# Patient Record
Sex: Female | Born: 1970
Health system: Southern US, Community
[De-identification: ages and names within clinical notes are randomized; demographics above are authoritative.]

## PROBLEM LIST (undated history)

## (undated) DIAGNOSIS — I2699 Other pulmonary embolism without acute cor pulmonale: Secondary | ICD-10-CM

## (undated) DIAGNOSIS — D649 Anemia, unspecified: Secondary | ICD-10-CM

## (undated) HISTORY — PX: TUBAL LIGATION: SHX77

## (undated) HISTORY — PX: TOTAL HIP ARTHROPLASTY: SHX124

## (undated) HISTORY — PX: GASTRIC BYPASS: SHX52

## (undated) HISTORY — PX: CHOLECYSTECTOMY: SHX55

---

## 1998-06-11 ENCOUNTER — Emergency Department (HOSPITAL_COMMUNITY): Admission: EM | Admit: 1998-06-11 | Discharge: 1998-06-12 | Payer: Self-pay | Admitting: Emergency Medicine

## 1998-08-02 ENCOUNTER — Other Ambulatory Visit: Admission: RE | Admit: 1998-08-02 | Discharge: 1998-08-02 | Payer: Self-pay | Admitting: Obstetrics

## 1999-07-31 ENCOUNTER — Other Ambulatory Visit: Admission: RE | Admit: 1999-07-31 | Discharge: 1999-07-31 | Payer: Self-pay | Admitting: Obstetrics

## 2010-06-03 ENCOUNTER — Encounter: Admission: RE | Admit: 2010-06-03 | Discharge: 2010-06-03 | Payer: Self-pay | Admitting: Family Medicine

## 2012-01-22 ENCOUNTER — Other Ambulatory Visit: Payer: Self-pay | Admitting: Family Medicine

## 2012-01-22 DIAGNOSIS — Z1231 Encounter for screening mammogram for malignant neoplasm of breast: Secondary | ICD-10-CM

## 2012-02-09 ENCOUNTER — Ambulatory Visit
Admission: RE | Admit: 2012-02-09 | Discharge: 2012-02-09 | Disposition: A | Discharge status: Home / Self Care | Payer: Private Health Insurance - Indemnity | Source: Ambulatory Visit | Attending: Family Medicine | Admitting: Family Medicine

## 2012-02-09 DIAGNOSIS — Z1231 Encounter for screening mammogram for malignant neoplasm of breast: Secondary | ICD-10-CM

## 2014-07-11 ENCOUNTER — Emergency Department (HOSPITAL_COMMUNITY): Payer: Medicaid Other

## 2014-07-11 ENCOUNTER — Encounter (HOSPITAL_COMMUNITY): Payer: Self-pay | Admitting: Emergency Medicine

## 2014-07-11 ENCOUNTER — Emergency Department (HOSPITAL_COMMUNITY)
Admission: EM | Admit: 2014-07-11 | Discharge: 2014-07-11 | Disposition: A | Discharge status: Home / Self Care | Payer: Medicaid Other | Attending: Emergency Medicine | Admitting: Emergency Medicine

## 2014-07-11 DIAGNOSIS — R209 Unspecified disturbances of skin sensation: Secondary | ICD-10-CM | POA: Insufficient documentation

## 2014-07-11 DIAGNOSIS — R109 Unspecified abdominal pain: Secondary | ICD-10-CM | POA: Insufficient documentation

## 2014-07-11 DIAGNOSIS — R42 Dizziness and giddiness: Secondary | ICD-10-CM | POA: Insufficient documentation

## 2014-07-11 DIAGNOSIS — R51 Headache: Secondary | ICD-10-CM | POA: Diagnosis not present

## 2014-07-11 DIAGNOSIS — R202 Paresthesia of skin: Secondary | ICD-10-CM

## 2014-07-11 DIAGNOSIS — Z3202 Encounter for pregnancy test, result negative: Secondary | ICD-10-CM | POA: Diagnosis not present

## 2014-07-11 DIAGNOSIS — R079 Chest pain, unspecified: Secondary | ICD-10-CM | POA: Insufficient documentation

## 2014-07-11 LAB — URINALYSIS, ROUTINE W REFLEX MICROSCOPIC
Bilirubin Urine: NEGATIVE
Glucose, UA: NEGATIVE mg/dL
Hgb urine dipstick: NEGATIVE
Ketones, ur: NEGATIVE mg/dL
Leukocytes, UA: NEGATIVE
Nitrite: NEGATIVE
Protein, ur: NEGATIVE mg/dL
Specific Gravity, Urine: 1.008 (ref 1.005–1.030)
Urobilinogen, UA: 0.2 mg/dL (ref 0.0–1.0)
pH: 7 (ref 5.0–8.0)

## 2014-07-11 LAB — LIPASE, BLOOD: Lipase: 52 U/L (ref 11–59)

## 2014-07-11 LAB — I-STAT TROPONIN, ED: Troponin i, poc: 0 ng/mL (ref 0.00–0.08)

## 2014-07-11 LAB — COMPREHENSIVE METABOLIC PANEL
ALT: 31 U/L (ref 0–35)
AST: 71 U/L — ABNORMAL HIGH (ref 0–37)
Albumin: 3.4 g/dL — ABNORMAL LOW (ref 3.5–5.2)
Alkaline Phosphatase: 77 U/L (ref 39–117)
Anion gap: 12 (ref 5–15)
BUN: 12 mg/dL (ref 6–23)
CO2: 27 mEq/L (ref 19–32)
Calcium: 8.4 mg/dL (ref 8.4–10.5)
Chloride: 102 mEq/L (ref 96–112)
Creatinine, Ser: 0.75 mg/dL (ref 0.50–1.10)
GFR calc Af Amer: >90 mL/min (ref >90)
GFR calc non Af Amer: >90 mL/min (ref >90)
Glucose, Bld: 83 mg/dL (ref 70–99)
Potassium: 3.4 mEq/L — ABNORMAL LOW (ref 3.7–5.3)
Sodium: 141 mEq/L (ref 137–147)
Total Bilirubin: 0.5 mg/dL (ref 0.3–1.2)
Total Protein: 7.1 g/dL (ref 6.0–8.3)

## 2014-07-11 LAB — POC URINE PREG, ED: Preg Test, Ur: NEGATIVE

## 2014-07-11 LAB — CBC
HCT: 32.5 % — ABNORMAL LOW (ref 36.0–46.0)
Hemoglobin: 10.1 g/dL — ABNORMAL LOW (ref 12.0–15.0)
MCH: 24.8 pg — ABNORMAL LOW (ref 26.0–34.0)
MCHC: 31.1 g/dL (ref 30.0–36.0)
MCV: 79.9 fL (ref 78.0–100.0)
Platelets: 260 10*3/uL (ref 150–400)
RBC: 4.07 MIL/uL (ref 3.87–5.11)
RDW: 15 % (ref 11.5–15.5)
WBC: 5 10*3/uL (ref 4.0–10.5)

## 2014-07-11 MED ORDER — GI COCKTAIL ~~LOC~~
30.0000 mL | Freq: Once | ORAL | Status: AC
  Administered 2014-07-11: 30 mL via ORAL
  Filled 2014-07-11: qty 30

## 2014-07-11 NOTE — Discharge Instructions (Signed)
You can start taking daily baby aspirin. Follow up closely with primary care doctor. Return if symptoms are worsening. Your MRI today is normal with no signs of strokes.    Chest Pain (Nonspecific) It is often hard to give a diagnosis for the cause of chest pain. There is always a chance that your pain could be related to something serious, such as a heart attack or a blood clot in the lungs. You need to follow up with your doctor. HOME CARE  If antibiotic medicine was given, take it as directed by your doctor. Finish the medicine even if you start to feel better.  For the next few days, avoid activities that bring on chest pain. Continue physical activities as told by your doctor.  Do not use any tobacco products. This includes cigarettes, chewing tobacco, and e-cigarettes.  Avoid drinking alcohol.  Only take medicine as told by your doctor.  Follow your doctor's suggestions for more testing if your chest pain does not go away.  Keep all doctor visits you made. GET HELP IF:  Your chest pain does not go away, even after treatment.  You have a rash with blisters on your chest.  You have a fever. GET HELP RIGHT AWAY IF:   You have more pain or pain that spreads to your arm, neck, jaw, back, or belly (abdomen).  You have shortness of breath.  You cough more than usual or cough up blood.  You have very bad back or belly pain.  You feel sick to your stomach (nauseous) or throw up (vomit).  You have very bad weakness.  You pass out (faint).  You have chills. This is an emergency. Do not wait to see if the problems will go away. Call your local emergency services (911 in [place] Do not drive yourself to the hospital. MAKE SURE YOU:   Understand these instructions.  Will watch your condition.  Will get help right away if you are not doing well or get worse. Document Released: 06/02/2008 Document Revised: 12/20/2013 Document Reviewed: 06/02/2008 [facility] Patient  Information 2015 [place], [place]. This information is not intended to replace advice given to you by your health care provider. Make sure you discuss any questions you have with your health care provider.  Paresthesia Paresthesia is a burning or prickling feeling. This feeling can happen in any part of the body. It often happens in the hands, arms, legs, or feet. HOME CARE  Avoid drinking alcohol.  Try massage or needle therapy (acupuncture) to help with your problems.  Keep all doctor visits as told. GET HELP RIGHT AWAY IF:   You feel weak.  You have trouble walking or moving.  You have problems speaking or seeing.  You feel confused.  You cannot control when you poop (bowel movement) or pee (urinate).  You lose feeling (numbness) after an injury.  You pass out (faint).  Your burning or prickling feeling gets worse when you walk.  You have pain, cramps, or feel dizzy.  You have a rash. MAKE SURE YOU:   Understand these instructions.  Will watch your condition.  Will get help right away if you are not doing well or get worse. Document Released: 11/27/2008 Document Revised: 03/08/2012 Document Reviewed: 09/05/2011 [facility] Patient Information 2015 [place], [place]. This information is not intended to replace advice given to you by your health care provider. Make sure you discuss any questions you have with your health care provider.

## 2014-07-11 NOTE — ED Notes (Signed)
Patient reports she previously had chest pain that went away today, but has numbness to the left arm that woke her up at 4am this morning. Light headache, and patient reports she took 3 motrins at home.

## 2014-07-11 NOTE — ED Notes (Signed)
Tatyana, PA-C, at the bedside.  

## 2014-07-11 NOTE — ED Provider Notes (Signed)
CSN: [id number]     Arrival date & time 07/11/14  0555 History   First MD Initiated Contact with Patient 07/11/14 [phone]     Chief Complaint  Patient presents with  . Numbness  . Chest Pain     (Consider location/radiation/quality/duration/timing/severity/associated sxs/prior Treatment) HPI Jenna Davenport is a 43 y.o. female who presents to ED with complaint of left sided numbness, tingling, dizziness, chest pressure, headache, abdominal pain. States she has had intermittent left leg numbness over last several days. States this morning woke up at 4am with left arm tingling and numbness sensation throughout the entire arm and all fingers. States she moved from the couch at that time to bed to see if that will help. States developed chest pressure at that time. Around 5am pt states she started feeling dizzy and off balance. The tingling and numbness continued in left arm. Pt states on the way here she developed left upper abdominal pain. Per RN, pt off balance walking. Pt denies any medical problems.   History reviewed. No pertinent past medical history. Past Surgical History  Procedure Laterality Date  . Tubal ligation      2008  . Cholecystectomy      2007  . Gastric bypass      2011   Family History  Problem Relation Age of Onset  . Seizures Mother   . Stroke Mother   . Heart failure Mother   . Hyperlipidemia Mother   . Hypertension Father   . Diabetes Father   . Seizures Father   . Hyperlipidemia Father    History  Substance Use Topics  . Smoking status: Never Smoker   . Smokeless tobacco: Not on file  . Alcohol Use: Yes     Comment: occasionally   OB History   Grav Para Term Preterm Abortions TAB SAB Ect Mult Living                 Review of Systems  Constitutional: Negative for fever and chills.  Respiratory: Positive for chest tightness. Negative for cough and shortness of breath.   Cardiovascular: Positive for chest pain. Negative for palpitations and leg  swelling.  Gastrointestinal: Positive for abdominal pain. Negative for nausea, vomiting and diarrhea.  Genitourinary: Negative for dysuria and flank pain.  Musculoskeletal: Negative for arthralgias, myalgias, neck pain and neck stiffness.  Skin: Negative for rash.  Neurological: Positive for dizziness, light-headedness and headaches. Negative for weakness.  All other systems reviewed and are negative.     Allergies  Pollen extract  Home Medications   Prior to Admission medications   Not on File   BP 173/96  Pulse 60  Temp(Src) 98 F (36.7 C) (Oral)  Resp 16  Ht 5\' 6"  (1.676 m)  Wt 211 lb (95.709 kg)  BMI 34.07 kg/m2  SpO2 100% Physical Exam  Nursing note and vitals reviewed. Constitutional: She is oriented to person, place, and time. She appears well-developed and well-nourished. No distress.  HENT:  Head: Normocephalic.  Eyes: Conjunctivae and EOM are normal. Pupils are equal, round, and reactive to light.  Neck: Normal range of motion. Neck supple.  Cardiovascular: Normal rate, regular rhythm and normal heart sounds.   Pulmonary/Chest: Effort normal and breath sounds normal. No respiratory distress. She has no wheezes. She has no rales.  Abdominal: Soft. Bowel sounds are normal. She exhibits no distension. There is no tenderness. There is no rebound.  Musculoskeletal: She exhibits no edema.  Neurological: She is alert and oriented to person, place,  and time. No cranial nerve deficit. She exhibits normal muscle tone. Coordination normal.  5/5 and equal upper and lower extremity strength bilaterally. Sensation equal over bilateral upper and lower extremities in all dermatomes. DP and distal radial pulses intact and equal. Equal grip strength bilaterally. Normal finger to nose and heel to shin. No pronator drift.   Skin: Skin is warm and dry.  Psychiatric: She has a normal mood and affect. Her behavior is normal.    ED Course  Procedures (including critical care  time) Labs Review Labs Reviewed  CBC  BASIC METABOLIC PANEL  I-STAT TROPOININ, ED  POC URINE PREG, ED    Imaging Review No results found.   EKG Interpretation   Date/Time:  Tuesday July 11 2014 06:11:26 EDT Ventricular Rate:  59 PR Interval:  146 QRS Duration: 90 QT Interval:  440 QTC Calculation: 436 R Axis:   12 Text Interpretation:  Sinus rhythm Low voltage, precordial leads Baseline  wander in lead(s) II III aVF Abnormal ekg No old tracing to compare  Confirmed by MILLER  MD, BRIAN ([id number]) on 07/11/2014 6:18:12 AM      MDM   Final diagnoses:  Paresthesia of left arm  Chest pain, unspecified chest pain type    Patient is here with multiple complaints. She mostly concerned with left arm "tingling and numbness" that she woke up with this morning. Also complainng of dizziness, abdominal pain, chest pain, headache. Pt admits to a lot of stress. Abdomen soft. Neurological exam normal. Will get labs, CT head, CXR, UA.   10:54 AM Pt had a possible old lacunar infarct on CT, MRI obtained and is negative. Patient admits to recent loss of a family relative and funeral just 2 days ago. She admits to a lot of stress and not sleeping much. I suspect his symptoms could be related to stress and anxiety versus possible radiculopathy. I this time no neuro deficits on exam. Patient stable for discharge home with outpatient followup. Upon discharge her gait is stable with no ataxia.  Filed Vitals:   07/11/14 0831 07/11/14 1017 07/11/14 1030 07/11/14 1045  BP:  143/89 145/74 146/68  Pulse:  59 57 58  Temp: 97.9 F (36.6 C)     TempSrc:      Resp:  20 14 14   Height:      Weight:      SpO2:  99% 100% 100%       Lottie Musselatyana A Laszlo Ellerby, PA-C 07/11/14 1523  Kaaren Nass A Millissa Deese, PA-C 07/11/14 1524

## 2014-07-11 NOTE — ED Notes (Signed)
Discussed with patient that urine sample is needed. Patient reports she used the bathroom prior to arrival.

## 2014-07-11 NOTE — ED Notes (Signed)
Portable x-ray at the bedside. Verbal order for CMP and lipase due to stomach pain from [place], PA-C, and clarified not a code stroke at this time.

## 2014-07-13 NOTE — ED Provider Notes (Signed)
Medical screening examination/treatment/procedure(s) were performed by non-physician practitioner and as supervising physician I was immediately available for consultation/collaboration.   EKG Interpretation   Date/Time:  Tuesday July 11 2014 06:11:26 EDT Ventricular Rate:  59 PR Interval:  146 QRS Duration: 90 QT Interval:  440 QTC Calculation: 436 R Axis:   12 Text Interpretation:  Sinus rhythm Low voltage, precordial leads Baseline  wander in lead(s) II III aVF Abnormal ekg No old tracing to compare  Confirmed by MILLER  MD, BRIAN ([id number]) on 07/11/2014 6:18:12 AM       Jenna RazorStephen Kalysta Kneisley, MD 07/13/14 [phone]

## 2016-03-04 ENCOUNTER — Other Ambulatory Visit (HOSPITAL_COMMUNITY)
Admission: RE | Admit: 2016-03-04 | Discharge: 2016-03-04 | Disposition: A | Discharge status: Home / Self Care | Payer: BLUE CROSS/BLUE SHIELD | Source: Ambulatory Visit | Attending: Obstetrics and Gynecology | Admitting: Obstetrics and Gynecology

## 2016-03-04 ENCOUNTER — Other Ambulatory Visit: Payer: Self-pay | Admitting: Obstetrics and Gynecology

## 2016-03-04 DIAGNOSIS — Z01419 Encounter for gynecological examination (general) (routine) without abnormal findings: Secondary | ICD-10-CM | POA: Insufficient documentation

## 2016-03-04 DIAGNOSIS — Z1151 Encounter for screening for human papillomavirus (HPV): Secondary | ICD-10-CM | POA: Diagnosis present

## 2016-03-04 DIAGNOSIS — R87810 Cervical high risk human papillomavirus (HPV) DNA test positive: Secondary | ICD-10-CM | POA: Insufficient documentation

## 2016-03-06 LAB — CYTOLOGY - PAP

## 2016-11-08 ENCOUNTER — Emergency Department (HOSPITAL_COMMUNITY): Payer: BLUE CROSS/BLUE SHIELD

## 2016-11-08 ENCOUNTER — Encounter (HOSPITAL_COMMUNITY): Payer: Self-pay | Admitting: Emergency Medicine

## 2016-11-08 ENCOUNTER — Inpatient Hospital Stay (HOSPITAL_COMMUNITY)
Admission: EM | Admit: 2016-11-08 | Discharge: 2016-11-11 | DRG: 176 | Disposition: A | Discharge status: Home / Self Care | Payer: BLUE CROSS/BLUE SHIELD | Attending: Family Medicine | Admitting: Family Medicine

## 2016-11-08 DIAGNOSIS — Z8342 Family history of familial hypercholesterolemia: Secondary | ICD-10-CM

## 2016-11-08 DIAGNOSIS — R319 Hematuria, unspecified: Secondary | ICD-10-CM | POA: Diagnosis present

## 2016-11-08 DIAGNOSIS — R109 Unspecified abdominal pain: Secondary | ICD-10-CM | POA: Diagnosis present

## 2016-11-08 DIAGNOSIS — I119 Hypertensive heart disease without heart failure: Secondary | ICD-10-CM | POA: Diagnosis present

## 2016-11-08 DIAGNOSIS — I2699 Other pulmonary embolism without acute cor pulmonale: Secondary | ICD-10-CM | POA: Diagnosis not present

## 2016-11-08 DIAGNOSIS — Z6837 Body mass index (BMI) 37.0-37.9, adult: Secondary | ICD-10-CM

## 2016-11-08 DIAGNOSIS — I10 Essential (primary) hypertension: Secondary | ICD-10-CM | POA: Diagnosis present

## 2016-11-08 DIAGNOSIS — Z79899 Other long term (current) drug therapy: Secondary | ICD-10-CM

## 2016-11-08 DIAGNOSIS — Z823 Family history of stroke: Secondary | ICD-10-CM

## 2016-11-08 DIAGNOSIS — R0609 Other forms of dyspnea: Secondary | ICD-10-CM

## 2016-11-08 DIAGNOSIS — R000 Tachycardia, unspecified: Secondary | ICD-10-CM | POA: Diagnosis present

## 2016-11-08 DIAGNOSIS — R21 Rash and other nonspecific skin eruption: Secondary | ICD-10-CM | POA: Diagnosis present

## 2016-11-08 DIAGNOSIS — Z8249 Family history of ischemic heart disease and other diseases of the circulatory system: Secondary | ICD-10-CM

## 2016-11-08 DIAGNOSIS — R3121 Asymptomatic microscopic hematuria: Secondary | ICD-10-CM | POA: Diagnosis present

## 2016-11-08 DIAGNOSIS — Z9049 Acquired absence of other specified parts of digestive tract: Secondary | ICD-10-CM

## 2016-11-08 DIAGNOSIS — R0600 Dyspnea, unspecified: Secondary | ICD-10-CM | POA: Diagnosis present

## 2016-11-08 DIAGNOSIS — Z9884 Bariatric surgery status: Secondary | ICD-10-CM

## 2016-11-08 DIAGNOSIS — E669 Obesity, unspecified: Secondary | ICD-10-CM | POA: Diagnosis present

## 2016-11-08 DIAGNOSIS — D509 Iron deficiency anemia, unspecified: Secondary | ICD-10-CM | POA: Diagnosis present

## 2016-11-08 DIAGNOSIS — Z833 Family history of diabetes mellitus: Secondary | ICD-10-CM

## 2016-11-08 DIAGNOSIS — Z820 Family history of epilepsy and other diseases of the nervous system: Secondary | ICD-10-CM

## 2016-11-08 DIAGNOSIS — I82432 Acute embolism and thrombosis of left popliteal vein: Secondary | ICD-10-CM | POA: Diagnosis present

## 2016-11-08 HISTORY — DX: Anemia, unspecified: D64.9

## 2016-11-08 LAB — URINALYSIS, ROUTINE W REFLEX MICROSCOPIC
Bilirubin Urine: NEGATIVE
Glucose, UA: NEGATIVE mg/dL
Ketones, ur: NEGATIVE mg/dL
Leukocytes, UA: NEGATIVE
Nitrite: NEGATIVE
Protein, ur: NEGATIVE mg/dL
Specific Gravity, Urine: 1.013 (ref 1.005–1.030)
pH: 6 (ref 5.0–8.0)

## 2016-11-08 LAB — BASIC METABOLIC PANEL
Anion gap: 6 (ref 5–15)
BUN: 8 mg/dL (ref 6–20)
CO2: 27 mmol/L (ref 22–32)
Calcium: 8.7 mg/dL — ABNORMAL LOW (ref 8.9–10.3)
Chloride: 101 mmol/L (ref 101–111)
Creatinine, Ser: 0.79 mg/dL (ref 0.44–1.00)
GFR calc Af Amer: >60 mL/min (ref >60)
GFR calc non Af Amer: >60 mL/min (ref >60)
Glucose, Bld: 116 mg/dL — ABNORMAL HIGH (ref 65–99)
Potassium: 4.2 mmol/L (ref 3.5–5.1)
Sodium: 134 mmol/L — ABNORMAL LOW (ref 135–145)

## 2016-11-08 LAB — CBC WITH DIFFERENTIAL/PLATELET
Basophils Absolute: 0 10*3/uL (ref 0.0–0.1)
Basophils Relative: 0 %
Eosinophils Absolute: 0 10*3/uL (ref 0.0–0.7)
Eosinophils Relative: 1 %
HCT: 39.6 % (ref 36.0–46.0)
Hemoglobin: 12.8 g/dL (ref 12.0–15.0)
Lymphocytes Relative: 13 %
Lymphs Abs: 0.9 10*3/uL (ref 0.7–4.0)
MCH: 28.5 pg (ref 26.0–34.0)
MCHC: 32.3 g/dL (ref 30.0–36.0)
MCV: 88.2 fL (ref 78.0–100.0)
Monocytes Absolute: 0.9 10*3/uL (ref 0.1–1.0)
Monocytes Relative: 12 %
Neutro Abs: 5.6 10*3/uL (ref 1.7–7.7)
Neutrophils Relative %: 74 %
Platelets: 257 10*3/uL (ref 150–400)
RBC: 4.49 MIL/uL (ref 3.87–5.11)
RDW: 14.7 % (ref 11.5–15.5)
WBC: 7.5 10*3/uL (ref 4.0–10.5)

## 2016-11-08 LAB — HEPATIC FUNCTION PANEL
ALT: 11 U/L — ABNORMAL LOW (ref 14–54)
AST: 22 U/L (ref 15–41)
Albumin: 3.7 g/dL (ref 3.5–5.0)
Alkaline Phosphatase: 62 U/L (ref 38–126)
Bilirubin, Direct: 0.2 mg/dL (ref 0.1–0.5)
Indirect Bilirubin: 0.4 mg/dL (ref 0.3–0.9)
Total Bilirubin: 0.6 mg/dL (ref 0.3–1.2)
Total Protein: 8.3 g/dL — ABNORMAL HIGH (ref 6.5–8.1)

## 2016-11-08 LAB — URINE MICROSCOPIC-ADD ON

## 2016-11-08 LAB — PREGNANCY, URINE: Preg Test, Ur: NEGATIVE

## 2016-11-08 LAB — LIPASE, BLOOD: Lipase: 29 U/L (ref 11–51)

## 2016-11-08 MED ORDER — IOPAMIDOL (ISOVUE-370) INJECTION 76%
100.0000 mL | Freq: Once | INTRAVENOUS | Status: AC | PRN
  Administered 2016-11-08: 100 mL via INTRAVENOUS

## 2016-11-08 MED ORDER — KETOROLAC TROMETHAMINE 30 MG/ML IJ SOLN
30.0000 mg | Freq: Once | INTRAMUSCULAR | Status: AC
  Administered 2016-11-08: 30 mg via INTRAVENOUS
  Filled 2016-11-08: qty 1

## 2016-11-08 MED ORDER — MORPHINE SULFATE (PF) 4 MG/ML IV SOLN
4.0000 mg | Freq: Once | INTRAVENOUS | Status: AC
  Administered 2016-11-08: 4 mg via INTRAVENOUS
  Filled 2016-11-08: qty 1

## 2016-11-08 MED ORDER — SODIUM CHLORIDE 0.9 % IV BOLUS (SEPSIS)
500.0000 mL | Freq: Once | INTRAVENOUS | Status: AC
  Administered 2016-11-08: 500 mL via INTRAVENOUS

## 2016-11-08 MED ORDER — ONDANSETRON HCL 4 MG/2ML IJ SOLN
4.0000 mg | Freq: Once | INTRAMUSCULAR | Status: AC
  Administered 2016-11-08: 4 mg via INTRAVENOUS
  Filled 2016-11-08: qty 2

## 2016-11-08 NOTE — ED Notes (Signed)
O2 sats 89-91%.

## 2016-11-08 NOTE — ED Triage Notes (Signed)
Per pt, states right flank pain since last Sat-[facility]thinks it might be a kidney stone

## 2016-11-08 NOTE — ED Provider Notes (Signed)
[facility]-EMERGENCY DEPT Provider Note   CSN: [id number] Arrival date & time: 11/08/16  1636     History   Chief Complaint Chief Complaint  Patient presents with  . Flank Pain    HPI Jenna Davenport is a 45 y.o. female.  Patient is a 45 year old female with past medical history of cholecystectomy, tubal ligation, and gastric bypass. She presents with a one-week history of intermittent pain in her right flank. She was seen at urgent care earlier this week and was told she had blood in her urine. A kidney stone was suspected and she was given pain medication. She presents here today with ongoing discomfort. She denies any fevers or chills. Her pain is worse with movement and palpation.   The history is provided by the patient.  Flank Pain  This is a new problem. Episode onset: One week ago. The problem occurs constantly. The problem has been gradually worsening. Associated symptoms include abdominal pain. Nothing aggravates the symptoms. Nothing relieves the symptoms. Treatments tried: Oxycodone. The treatment provided no relief.    History reviewed. No pertinent past medical history.  There are no active problems to display for this patient.   Past Surgical History:  Procedure Laterality Date  . CHOLECYSTECTOMY     2007  . GASTRIC BYPASS     2011  . TUBAL LIGATION     2008    OB History    No data available       Home Medications    Prior to Admission medications   Medication Sig Start Date End Date Taking? Authorizing Provider  Calcium Carb-Cholecalciferol (CALCIUM + D3 PO) Take 1,000 mg by mouth daily.    Historical Provider, MD  cholecalciferol (VITAMIN D) 1000 UNITS tablet Take 1,000 Units by mouth daily.    Historical Provider, MD  Multiple Vitamins-Minerals (MULTIPLE VITAMINS/WOMENS PO) Take 2 tablets by mouth daily. gummy    Historical Provider, MD    Family History Family History  Problem Relation Age of Onset  . Seizures Mother   . Stroke Mother   .  Heart failure Mother   . Hyperlipidemia Mother   . Hypertension Father   . Diabetes Father   . Seizures Father   . Hyperlipidemia Father     Social History Social History  Substance Use Topics  . Smoking status: Never Smoker  . Smokeless tobacco: Not on file  . Alcohol use Yes     Comment: occasionally     Allergies   Pollen extract   Review of Systems Review of Systems  Gastrointestinal: Positive for abdominal pain.  Genitourinary: Positive for flank pain.  All other systems reviewed and are negative.    Physical Exam Updated Vital Signs BP 162/98 (BP Location: Right Arm)   Pulse 112   Temp 98.2 F (36.8 C) (Oral)   Resp 22   SpO2 95%   Physical Exam  Constitutional: She is oriented to person, place, and time. She appears well-developed and well-nourished. No distress.  HENT:  Head: Normocephalic and atraumatic.  Neck: Normal range of motion. Neck supple.  Cardiovascular: Normal rate and regular rhythm.  Exam reveals no gallop and no friction rub.   No murmur heard. Pulmonary/Chest: Effort normal and breath sounds normal. No respiratory distress. She has no wheezes.  Abdominal: Soft. Bowel sounds are normal. She exhibits no distension. There is tenderness. There is no rebound and no guarding.  There is tenderness to palpation in the right upper quadrant and right flank.  Musculoskeletal: Normal range  of motion.  Neurological: She is alert and oriented to person, place, and time.  Skin: Skin is warm and dry. She is not diaphoretic.  Nursing note and vitals reviewed.    ED Treatments / Results  Labs (all labs ordered are listed, but only abnormal results are displayed) Labs Reviewed  URINALYSIS, ROUTINE W REFLEX MICROSCOPIC ([facility])  HEPATIC FUNCTION PANEL  LIPASE, BLOOD  CBC WITH DIFFERENTIAL/PLATELET    EKG  EKG Interpretation None       Radiology No results found.  Procedures Procedures (including critical care time)  Medications  Ordered in ED Medications  sodium chloride 0.9 % bolus 500 mL (not administered)  ondansetron (ZOFRAN) injection 4 mg (not administered)  morphine 4 MG/ML injection 4 mg (not administered)  ketorolac (TORADOL) 30 MG/ML injection 30 mg (not administered)     Initial Impression / Assessment and Plan / ED Course  I have reviewed the triage vital signs and the nursing notes.  Pertinent labs & imaging results that were available during my care of the patient were reviewed by me and considered in my medical decision making (see chart for details).  Clinical Course     Patient presents with complaints of flank pain and hematuria. She was seen at urgent care earlier in the week and prescribed Percocet, however this has not helped. Her workup today reveals no evidence for renal calculus, however the CT scan does show a small pleural effusion and possible infiltrate of the right lung.  When the patient was reassessed, was noted that she had some mild hypoxia and tachycardia. For this reason, a CT scan of the chest was obtained which did reveal bilateral pulmonary emboli. She will be admitted to the hospitalist service under the care of Dr. Antionette Charpyd.  Final Clinical Impressions(s) / ED Diagnoses   Final diagnoses:  None    New Prescriptions New Prescriptions   No medications on file     Geoffery Lyonsouglas Alizia Greif, MD 11/09/16 [phone]

## 2016-11-09 ENCOUNTER — Inpatient Hospital Stay (HOSPITAL_COMMUNITY): Payer: BLUE CROSS/BLUE SHIELD

## 2016-11-09 ENCOUNTER — Encounter (HOSPITAL_COMMUNITY): Payer: Self-pay | Admitting: Family Medicine

## 2016-11-09 DIAGNOSIS — Z8249 Family history of ischemic heart disease and other diseases of the circulatory system: Secondary | ICD-10-CM | POA: Diagnosis not present

## 2016-11-09 DIAGNOSIS — R000 Tachycardia, unspecified: Secondary | ICD-10-CM | POA: Diagnosis present

## 2016-11-09 DIAGNOSIS — I2609 Other pulmonary embolism with acute cor pulmonale: Secondary | ICD-10-CM

## 2016-11-09 DIAGNOSIS — I2699 Other pulmonary embolism without acute cor pulmonale: Secondary | ICD-10-CM | POA: Diagnosis present

## 2016-11-09 DIAGNOSIS — Z9884 Bariatric surgery status: Secondary | ICD-10-CM | POA: Diagnosis not present

## 2016-11-09 DIAGNOSIS — R109 Unspecified abdominal pain: Secondary | ICD-10-CM | POA: Diagnosis present

## 2016-11-09 DIAGNOSIS — R3121 Asymptomatic microscopic hematuria: Secondary | ICD-10-CM | POA: Diagnosis present

## 2016-11-09 DIAGNOSIS — Z9049 Acquired absence of other specified parts of digestive tract: Secondary | ICD-10-CM | POA: Diagnosis not present

## 2016-11-09 DIAGNOSIS — R21 Rash and other nonspecific skin eruption: Secondary | ICD-10-CM | POA: Diagnosis present

## 2016-11-09 DIAGNOSIS — Z833 Family history of diabetes mellitus: Secondary | ICD-10-CM | POA: Diagnosis not present

## 2016-11-09 DIAGNOSIS — D509 Iron deficiency anemia, unspecified: Secondary | ICD-10-CM | POA: Diagnosis present

## 2016-11-09 DIAGNOSIS — I119 Hypertensive heart disease without heart failure: Secondary | ICD-10-CM | POA: Diagnosis present

## 2016-11-09 DIAGNOSIS — I82432 Acute embolism and thrombosis of left popliteal vein: Secondary | ICD-10-CM | POA: Diagnosis present

## 2016-11-09 DIAGNOSIS — Z820 Family history of epilepsy and other diseases of the nervous system: Secondary | ICD-10-CM | POA: Diagnosis not present

## 2016-11-09 DIAGNOSIS — R319 Hematuria, unspecified: Secondary | ICD-10-CM | POA: Diagnosis present

## 2016-11-09 DIAGNOSIS — R0609 Other forms of dyspnea: Secondary | ICD-10-CM | POA: Diagnosis not present

## 2016-11-09 DIAGNOSIS — Z6837 Body mass index (BMI) 37.0-37.9, adult: Secondary | ICD-10-CM | POA: Diagnosis not present

## 2016-11-09 DIAGNOSIS — Z79899 Other long term (current) drug therapy: Secondary | ICD-10-CM | POA: Diagnosis not present

## 2016-11-09 DIAGNOSIS — Z8342 Family history of familial hypercholesterolemia: Secondary | ICD-10-CM | POA: Diagnosis not present

## 2016-11-09 DIAGNOSIS — E669 Obesity, unspecified: Secondary | ICD-10-CM | POA: Diagnosis present

## 2016-11-09 DIAGNOSIS — Z823 Family history of stroke: Secondary | ICD-10-CM | POA: Diagnosis not present

## 2016-11-09 DIAGNOSIS — R0600 Dyspnea, unspecified: Secondary | ICD-10-CM | POA: Diagnosis present

## 2016-11-09 LAB — BASIC METABOLIC PANEL
Anion gap: 5 (ref 5–15)
BUN: 7 mg/dL (ref 6–20)
CO2: 27 mmol/L (ref 22–32)
Calcium: 8.3 mg/dL — ABNORMAL LOW (ref 8.9–10.3)
Chloride: 105 mmol/L (ref 101–111)
Creatinine, Ser: 0.73 mg/dL (ref 0.44–1.00)
GFR calc Af Amer: >60 mL/min (ref >60)
GFR calc non Af Amer: >60 mL/min (ref >60)
Glucose, Bld: 105 mg/dL — ABNORMAL HIGH (ref 65–99)
Potassium: 4.2 mmol/L (ref 3.5–5.1)
Sodium: 137 mmol/L (ref 135–145)

## 2016-11-09 LAB — PROTIME-INR
INR: 1.23
Prothrombin Time: 15.5 seconds — ABNORMAL HIGH (ref 11.4–15.2)

## 2016-11-09 LAB — ECHOCARDIOGRAM COMPLETE
Height: 67 in
Weight: 3792 oz

## 2016-11-09 LAB — APTT: aPTT: 31 seconds (ref 24–36)

## 2016-11-09 MED ORDER — ORAL CARE MOUTH RINSE
15.0000 mL | Freq: Two times a day (BID) | OROMUCOSAL | Status: DC
  Administered 2016-11-09 – 2016-11-11 (×5): 15 mL via OROMUCOSAL

## 2016-11-09 MED ORDER — SODIUM CHLORIDE 0.9% FLUSH
3.0000 mL | Freq: Two times a day (BID) | INTRAVENOUS | Status: DC
  Administered 2016-11-09 – 2016-11-10 (×6): 3 mL via INTRAVENOUS

## 2016-11-09 MED ORDER — ADULT MULTIVITAMIN W/MINERALS CH
1.0000 | ORAL_TABLET | Freq: Every day | ORAL | Status: DC
  Administered 2016-11-09 – 2016-11-11 (×3): 1 via ORAL
  Filled 2016-11-09 (×3): qty 1

## 2016-11-09 MED ORDER — ENOXAPARIN SODIUM 120 MG/0.8ML ~~LOC~~ SOLN
110.0000 mg | Freq: Two times a day (BID) | SUBCUTANEOUS | Status: DC
Start: 2016-11-09 — End: 2016-11-10
  Administered 2016-11-09 (×3): 110 mg via SUBCUTANEOUS
  Filled 2016-11-09 (×4): qty 0.8

## 2016-11-09 MED ORDER — HYDROMORPHONE HCL 1 MG/ML IJ SOLN
0.5000 mg | INTRAMUSCULAR | Status: DC | PRN
  Administered 2016-11-09 – 2016-11-10 (×4): 1 mg via INTRAVENOUS
  Filled 2016-11-09 (×5): qty 1

## 2016-11-09 MED ORDER — HYDROCODONE-ACETAMINOPHEN 5-325 MG PO TABS
1.0000 | ORAL_TABLET | Freq: Four times a day (QID) | ORAL | Status: DC | PRN
  Administered 2016-11-09 – 2016-11-11 (×7): 2 via ORAL
  Filled 2016-11-09 (×7): qty 2

## 2016-11-09 MED ORDER — ONDANSETRON HCL 4 MG/2ML IJ SOLN: 4.0000 mg | Freq: Four times a day (QID) | INTRAMUSCULAR | Status: DC | PRN

## 2016-11-09 MED ORDER — SODIUM CHLORIDE 0.9 % IV SOLN: 250.0000 mL | INTRAVENOUS | Status: DC | PRN

## 2016-11-09 MED ORDER — ACETAMINOPHEN 650 MG RE SUPP: 650.0000 mg | Freq: Four times a day (QID) | RECTAL | Status: DC | PRN

## 2016-11-09 MED ORDER — ONDANSETRON HCL 4 MG PO TABS: 4.0000 mg | ORAL_TABLET | Freq: Four times a day (QID) | ORAL | Status: DC | PRN

## 2016-11-09 MED ORDER — ACETAMINOPHEN 325 MG PO TABS: 650.0000 mg | ORAL_TABLET | Freq: Four times a day (QID) | ORAL | Status: DC | PRN

## 2016-11-09 MED ORDER — SODIUM CHLORIDE 0.9% FLUSH: 3.0000 mL | INTRAVENOUS | Status: DC | PRN

## 2016-11-09 MED ORDER — FERROUS SULFATE 325 (65 FE) MG PO TABS
325.0000 mg | ORAL_TABLET | Freq: Two times a day (BID) | ORAL | Status: DC
  Administered 2016-11-09 – 2016-11-11 (×4): 325 mg via ORAL
  Filled 2016-11-09 (×4): qty 1

## 2016-11-09 MED ORDER — POLYETHYLENE GLYCOL 3350 17 G PO PACK: 17.0000 g | PACK | Freq: Every day | ORAL | Status: DC | PRN

## 2016-11-09 MED ORDER — SODIUM CHLORIDE 0.9% FLUSH
3.0000 mL | Freq: Two times a day (BID) | INTRAVENOUS | Status: DC
  Administered 2016-11-09 – 2016-11-11 (×5): 3 mL via INTRAVENOUS

## 2016-11-09 NOTE — Progress Notes (Signed)
11/08/2016  4:58 PM  11/09/2016 9:23 AM  Jenna Davenport was seen and examined.  The H&P by the admitting provider , orders, imaging was reviewed.  Please see orders.  Will continue to follow.   Maryln Manuel. Collin Rengel, MD [facility]

## 2016-11-09 NOTE — Progress Notes (Signed)
**  Preliminary report by tech**  Bilateral lower extremity venous duplex complete. There is evidence of acute deep vein thrombosis involving the popliteal vein of the left lower extremity.  There is no evidence of superficial vein thrombosis involving the left lower extremity.  There is no evidence of deep or superficial vein thrombosis involving the right lower extremity.  There is no evidence of a Baker's cyst bilaterally. Results given to the patient's nurse, Darcel BayleyKamna.  11/09/16 11:35 AM Olen CordialGreg Davarius Ridener RVT

## 2016-11-09 NOTE — Progress Notes (Signed)
*  PRELIMINARY RESULTS* Echocardiogram 2D Echocardiogram has been performed.  Jenna SectionKristy [name] 11/09/2016, 10:32 AM

## 2016-11-09 NOTE — Progress Notes (Addendum)
Received call from Olen CordialGreg Collins, RVT that patient is positive for DVT in left popliteal vein in LLE.  Dr. Laural BenesJohnson paged, aware.  Patient is already being treated for PE at this time. Will continue to monitor with telemetry and 02 and administer patient's lovenox per orders.

## 2016-11-09 NOTE — Progress Notes (Signed)
ANTICOAGULATION CONSULT NOTE - Initial Consult  Pharmacy Consult for Enoxaparin Indication: pulmonary embolus  Allergies  Allergen Reactions  . Pollen Extract Other (See Comments)    Itchy, watery eyes     Patient Measurements: Height: 5\' 7"  (170.2 cm) Weight: 238 lb 1.6 oz (108 kg) IBW/kg (Calculated) : 61.6 Heparin Dosing Weight:   Vital Signs: Temp: 98.3 F (36.8 C) (11/12 0120) Temp Source: Oral (11/12 0120) BP: 142/97 (11/12 0120) Pulse Rate: 97 (11/12 0120)  Labs:  Recent Labs  11/08/16 1740 11/09/16 0037  HGB 12.8  --   HCT 39.6  --   PLT 257  --   APTT  --  31  LABPROT  --  15.5*  INR  --  1.23  CREATININE 0.79 0.73    Estimated Creatinine Clearance: 110.1 mL/min (by C-G formula based on SCr of 0.73 mg/dL).   Medical History: Past Medical History:  Diagnosis Date  . Anemia     Medications:  Scheduled:  . enoxaparin (LOVENOX) injection  110 mg Subcutaneous BID  . ferrous sulfate  325 mg Oral BID WC  . multivitamin with minerals  1 tablet Oral Daily  . sodium chloride flush  3 mL Intravenous Q12H  . sodium chloride flush  3 mL Intravenous Q12H    Assessment: Patient with new PE in ED. No oral anticoagulants noted on med rec. Also note Microscopic hematuria and Iron-deficiency anemia per H&P.  Goal of Therapy:  Anti-Xa level 0.6-1 units/ml 4hrs after LMWH dose given Monitor platelets by anticoagulation protocol: Yes   Plan:  Enoxaparin 110mg  sq q12hr CBC q 3 days  Darlina GuysGrimsley Jr, Jacquenette ShoneJulian Crowford 11/09/2016,3:34 AM

## 2016-11-09 NOTE — H&P (Signed)
History and Physical    Jenna Davenport ZOX:[id number] DOB: September 02, 1971 DOA: 11/08/2016  PCP: Temple Pacini, DO   Patient coming from: Home  Chief Complaint: Pain at RUQ and right flank, dyspnea  HPI: Jenna Davenport is a 45 y.o. female with medical history significant for iron deficiency anemia, obesity status post gastric sleeve, and a rash for which she takes Plaquenil, now presenting to the emergency department for evaluation of pain under the right breast radiating around to the right flank for the past week. Patient reports that she had been in her usual state of health until developing some pain under the right breast approximately one week ago which she describes as severe, radiating around to the right flank, worse with deep inspiration or cough, and associated with exertional dyspnea. The patient denies any chest pain or palpitations and denies leg swelling or tenderness. She reports working from home and describes a very sedentary lifestyle, spending waking hours in front of a computer and going several days at a time without leaving the house. She denies any personal or family history of VTE, is not taking hormones, is a nonsmoker, and denies any recent long distance travel, surgery, or trauma. She was evaluated for these complaints a few days ago in urgent care, microscopic hematuria was noted on urinalysis, patient was diagnosed with probable kidney stones and prescribed hydrocodone. Symptoms continued to progress despite this and so she now presents to the ED.  ED Course: Upon arrival to the ED, patient is found to be afebrile, saturating mid 90s on room air while at rest, mildly tachypneic, tachycardic in the 110s, and with stable blood pressure. Chemistry panel is largely unremarkable and CBC is within the normal limits. Urinalysis is notable only for small hemoglobin. CT of the abdomen was obtained with renal stone protocol and is negative for obstructive uropathy or nephrolithiasis, but  notable for consolidation or collapse of the lower lungs bilaterally. Given the tachypnea and pleuritic component to the pain, this was followed by a CTA PE study which, while limited secondary to bolus timing, suggest acute segmental PE in the left lung and suspicion per radiology for additional PE in the right lung. Patient had been treated with 500 mL normal saline in the emergency department and given Toradol, morphine, and Zofran. She remained mildly tachycardic and would desaturate with minimal exertion. Blood pressure remains stable. She will be admitted to the telemetry unit for ongoing evaluation and management of acute PE suspected secondary to obesity and extremely sedentary lifestyle.  Review of Systems:  All other systems reviewed and apart from HPI, are negative.  Past Medical History:  Diagnosis Date  . Anemia     Past Surgical History:  Procedure Laterality Date  . CHOLECYSTECTOMY     2007  . GASTRIC BYPASS     2011  . TUBAL LIGATION     2008     reports that she has never smoked. She has never used smokeless tobacco. She reports that she drinks alcohol. She reports that she does not use drugs.  Allergies  Allergen Reactions  . Pollen Extract Other (See Comments)    Itchy, watery eyes     Family History  Problem Relation Age of Onset  . Seizures Mother   . Stroke Mother   . Heart failure Mother   . Hyperlipidemia Mother   . Hypertension Father   . Diabetes Father   . Seizures Father   . Hyperlipidemia Father      Prior to  Admission medications   Medication Sig Start Date End Date Taking? Authorizing Provider  ferrous sulfate 325 (65 FE) MG tablet Take 325 mg by mouth 2 (two) times daily with a meal.   Yes Historical Provider, MD  halobetasol (ULTRAVATE) 0.05 % ointment Apply 1 application topically 2 (two) times daily.   Yes Historical Provider, MD  HYDROcodone-acetaminophen (NORCO/VICODIN) 5-325 MG tablet Take 1 tablet by mouth every 6 (six) hours as  needed for moderate pain.   Yes Historical Provider, MD  hydroxychloroquine (PLAQUENIL) 200 MG tablet Take 200 mg by mouth 2 (two) times daily.   Yes Historical Provider, MD  Multiple Vitamins-Minerals (MULTIVITAMIN GUMMIES ADULT PO) Take 2 each by mouth daily.   Yes Historical Provider, MD  valACYclovir (VALTREX) 1000 MG tablet Take 1,000 mg by mouth daily.   Yes Historical Provider, MD    Physical Exam: Vitals:   11/08/16 1646 11/08/16 1903 11/08/16 2150 11/08/16 2150  BP: 162/98 139/77  139/72  Pulse: 112 99  102  Resp: 22 14  12   Temp: 98.2 F (36.8 C) 99 F (37.2 C)  99.6 F (37.6 C)  TempSrc: Oral Oral  Oral  SpO2: 95% 90%  97%  Weight:   106.6 kg (235 lb)   Height:   5\' 7"  (1.702 m)       Constitutional: Appears uncomfortable, mild tachypnea and increase in WOB, no pallor or diaphoresis Eyes: PERTLA, lids and conjunctivae normal ENMT: Mucous membranes are moist. Posterior pharynx clear of any exudate or lesions.   Neck: normal, supple, no masses, no thyromegaly Respiratory: Diminished at the bases, otherwise clear to auscultation. Accessory muscle recruitment.  Cardiovascular: Rate ~110 and regular with no appreciable murmur. No extremity edema. No significant JVD. Abdomen: No distension, no tenderness, no masses palpated. Bowel sounds normal.  Musculoskeletal: no clubbing / cyanosis. No joint deformity upper and lower extremities. Normal muscle tone.  Skin: Scattered hyperpigmented patches and papules with crust and surrounding depigmentation. Skin is otherwise warm, dry, well-perfused. Neurologic: CN 2-12 grossly intact. Sensation intact, DTR normal. Strength 5/5 in all 4 limbs.  Psychiatric: Normal judgment and insight. Alert and oriented x 3. Normal mood and affect.     Labs on Admission: I have personally reviewed following labs and imaging studies  CBC:  Recent Labs Lab 11/08/16 1740  WBC 7.5  NEUTROABS 5.6  HGB 12.8  HCT 39.6  MCV 88.2  PLT 257    Basic Metabolic Panel:  Recent Labs Lab 11/08/16 1740  NA 134*  K 4.2  CL 101  CO2 27  GLUCOSE 116*  BUN 8  CREATININE 0.79  CALCIUM 8.7*   GFR: Estimated Creatinine Clearance: 109.2 mL/min (by C-G formula based on SCr of 0.79 mg/dL). Liver Function Tests:  Recent Labs Lab 11/08/16 1740  AST 22  ALT 11*  ALKPHOS 62  BILITOT 0.6  PROT 8.3*  ALBUMIN 3.7    Recent Labs Lab 11/08/16 1740  LIPASE 29   No results for input(s): AMMONIA in the last 168 hours. Coagulation Profile: No results for input(s): INR, PROTIME in the last 168 hours. Cardiac Enzymes: No results for input(s): CKTOTAL, CKMB, CKMBINDEX, TROPONINI in the last 168 hours. BNP (last 3 results) No results for input(s): PROBNP in the last 8760 hours. HbA1C: No results for input(s): HGBA1C in the last 72 hours. CBG: No results for input(s): GLUCAP in the last 168 hours. Lipid Profile: No results for input(s): CHOL, HDL, LDLCALC, TRIG, CHOLHDL, LDLDIRECT in the last 72 hours. Thyroid Function  Tests: No results for input(s): TSH, T4TOTAL, FREET4, T3FREE, THYROIDAB in the last 72 hours. Anemia Panel: No results for input(s): VITAMINB12, FOLATE, FERRITIN, TIBC, IRON, RETICCTPCT in the last 72 hours. Urine analysis:    Component Value Date/Time   COLORURINE YELLOW 11/08/2016 1711   APPEARANCEUR CLOUDY (A) 11/08/2016 1711   LABSPEC 1.013 11/08/2016 1711   PHURINE 6.0 11/08/2016 1711   GLUCOSEU NEGATIVE 11/08/2016 1711   HGBUR SMALL (A) 11/08/2016 1711   BILIRUBINUR NEGATIVE 11/08/2016 1711   KETONESUR NEGATIVE 11/08/2016 1711   PROTEINUR NEGATIVE 11/08/2016 1711   UROBILINOGEN 0.2 07/11/2014 0800   NITRITE NEGATIVE 11/08/2016 1711   LEUKOCYTESUR NEGATIVE 11/08/2016 1711   Sepsis Labs: @LABRCNTIP (procalcitonin:4,lacticidven:4) )No results found for this or any previous visit (from the past 240 hour(s)).   Radiological Exams on Admission: Ct Angio Chest Pe W Or Wo Contrast  Result Date:  11/08/2016 CLINICAL DATA:  Bibasilar pneumonia. EXAM: CT ANGIOGRAPHY CHEST WITH CONTRAST TECHNIQUE: Multidetector CT imaging of the chest was performed using the standard protocol during bolus administration of intravenous contrast. Multiplanar CT image reconstructions and MIPs were obtained to evaluate the vascular anatomy. CONTRAST:  Contrast 100 cc Isovue 370 COMPARISON:  CT abdomen pelvis from earlier today. FINDINGS: Cardiovascular: The heart is enlarged. No pericardial effusion. No thoracic aortic aneurysm. No evidence for thoracic aortic dissection. Assessment of pulmonary arteries is limited by bolus timing and breathing motion, but the patient does appear to have segmental pulmonary embolus to the lingula (see image 99 series 10 and image 117 series 8). Mediastinum/Nodes: No mediastinal lymphadenopathy. There is no hilar lymphadenopathy. The esophagus has normal imaging features. There is no axillary lymphadenopathy. Lungs/Pleura: Right lower lobe collapse/ consolidation is associated with small right pleural effusion. Left lower lobe collapse/ consolidation noted without appreciable effusion. Upper Abdomen: Small cyst noted upper pole left kidney. Patient is status post gastric bypass. Musculoskeletal: Bone windows reveal no worrisome lytic or sclerotic osseous lesions. Review of the MIP images confirms the above findings. IMPRESSION: 1. Limited study with apparent small volume left upper lobe pulmonary embolus to segmental left lower lobe arteries. 2. Bibasilar collapse/consolidation, right greater than left with small right pleural effusion. Critical Value/emergent results were called by telephone at the time of interpretation on 11/08/2016 at 11:38 pm to Dr. Geoffery LyonsUGLAS DELO , who verbally acknowledged these results. Electronically Signed   By: Kennith CenterEric  Mansell M.D.   On: 11/08/2016 23:39   Ct Renal Stone Study  Result Date: 11/08/2016 CLINICAL DATA:  Right flank pain since last Saturday. EXAM: CT  ABDOMEN AND PELVIS WITHOUT CONTRAST TECHNIQUE: Multidetector CT imaging of the abdomen and pelvis was performed following the standard protocol without IV contrast. COMPARISON:  None. FINDINGS: Lower chest: Small right effusion with patchy bibasilar airspace opacities with air bronchograms involving both lower lobes, right middle lobe and lingula. Heart is normal in size. No pericardial effusion. Hepatobiliary: Cholecystectomy. No space-occupying mass or biliary dilatation of the unenhanced liver. Pancreas: Normal Spleen: No splenomegaly. Adrenals/Urinary Tract: No adrenal mass. Simple cyst in the upper pole of the left kidney measuring 2.7 cm in diameter. There is no nephrolithiasis. Small extrarenal pelves are noted. No obstructive uropathy. Bladder is physiologically distended. Stomach/Bowel: Status post bariatric surgery without bowel obstruction or acute inflammation. Moderate amount of fecal residue within large bowel. Vascular/Lymphatic: Aortoiliac atherosclerosis. No aneurysm. No adenopathy. Reproductive: Dominant follicle noted of the left ovary. This measures approximately 2.7 cm. Uterus is unremarkable. Other: Small phleboliths are seen in the left hemipelvis. Musculoskeletal: T6 through T11  mild disc space narrowing without acute osseous abnormality. Lower lumbar facet hypertrophy most pronounced from L4-5 through L5-S1 bilaterally. These contribute to mild to moderate canal stenosis at L4-5. No acute fracture nor bone destruction. IMPRESSION: Bibasilar pneumonic consolidations right greater than left with small right sided parapneumonic effusion. No obstructive uropathy.  No nephrolithiasis. 2.7 cm left upper pole simple renal cyst. Status post bariatric surgery. Status post cholecystectomy. Lower lumbar facet arthropathy L4 through S1 contributing to mild-to-moderate central canal stenosis at L4-5. Electronically Signed   By: Tollie Ethavid  Kwon M.D.   On: 11/08/2016 19:58    EKG: Ordered and pending.    Assessment/Plan  1. Acute PE  - Presents with pleuritic pain, tachycardia, dyspnea  - CTA study is limited by bolus timing, but reveals segmental PE in left lung and radiology expressed suspicion for right-sided PE as well  - Possible inciting factors include obesity and very sedentary lifestyle; she may have a cutaneous porphyria, but unclear if this presents increased risk for VTE  - BP has remained stable in ED  - She will be started on treatment with Lovenox and evaluated further with TTE and venous dopplers of LEs  - Case management consultation requested for assistance with ongoing anticoagulation    2. Microscopic hematuria  - Identified at an urgent care last week and again present on admission  - CT renal stone study negative for stones or obstruction; simple renal cyst noted, but no evidence for malignancy   - She is a non-smoker   3. 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 Iron-deficiency anemia - Hgb is 12.8 on admission  - Continue iron-supplementation    DVT prophylaxis: Lovenox per VTE treatment dosing Code Status: Full  Family Communication: Discussed with patient Disposition Plan: Admit to telemetry Consults called: None Admission status: Inpatient    Briscoe Deutscherimothy S Jakeb Lamping, MD [facility] Pager [phone]  If 7PM-7AM, please contact night-coverage [email or web address] Password [email or web address]  11/09/2016, 12:24 AM

## 2016-11-10 DIAGNOSIS — R000 Tachycardia, unspecified: Secondary | ICD-10-CM

## 2016-11-10 DIAGNOSIS — R21 Rash and other nonspecific skin eruption: Secondary | ICD-10-CM

## 2016-11-10 DIAGNOSIS — D509 Iron deficiency anemia, unspecified: Secondary | ICD-10-CM

## 2016-11-10 LAB — COMPREHENSIVE METABOLIC PANEL
ALT: 59 U/L — ABNORMAL HIGH (ref 14–54)
AST: 183 U/L — ABNORMAL HIGH (ref 15–41)
Albumin: 3.3 g/dL — ABNORMAL LOW (ref 3.5–5.0)
Alkaline Phosphatase: 99 U/L (ref 38–126)
Anion gap: 5 (ref 5–15)
BUN: 6 mg/dL (ref 6–20)
CO2: 29 mmol/L (ref 22–32)
Calcium: 8.4 mg/dL — ABNORMAL LOW (ref 8.9–10.3)
Chloride: 104 mmol/L (ref 101–111)
Creatinine, Ser: 0.75 mg/dL (ref 0.44–1.00)
GFR calc Af Amer: >60 mL/min (ref >60)
GFR calc non Af Amer: >60 mL/min (ref >60)
Glucose, Bld: 92 mg/dL (ref 65–99)
Potassium: 3.9 mmol/L (ref 3.5–5.1)
Sodium: 138 mmol/L (ref 135–145)
Total Bilirubin: 0.4 mg/dL (ref 0.3–1.2)
Total Protein: 7.5 g/dL (ref 6.5–8.1)

## 2016-11-10 LAB — CBC
HCT: 37.8 % (ref 36.0–46.0)
Hemoglobin: 12.4 g/dL (ref 12.0–15.0)
MCH: 28.4 pg (ref 26.0–34.0)
MCHC: 32.8 g/dL (ref 30.0–36.0)
MCV: 86.5 fL (ref 78.0–100.0)
Platelets: 257 10*3/uL (ref 150–400)
RBC: 4.37 MIL/uL (ref 3.87–5.11)
RDW: 14.4 % (ref 11.5–15.5)
WBC: 4.3 10*3/uL (ref 4.0–10.5)

## 2016-11-10 LAB — GLUCOSE, CAPILLARY: Glucose-Capillary: 82 mg/dL (ref 65–99)

## 2016-11-10 MED ORDER — APIXABAN 5 MG PO TABS: 5.0000 mg | ORAL_TABLET | Freq: Two times a day (BID) | ORAL | Status: DC

## 2016-11-10 MED ORDER — APIXABAN 5 MG PO TABS
10.0000 mg | ORAL_TABLET | Freq: Two times a day (BID) | ORAL | Status: DC
  Administered 2016-11-10 – 2016-11-11 (×3): 10 mg via ORAL
  Filled 2016-11-10 (×3): qty 2

## 2016-11-10 NOTE — Progress Notes (Signed)
PROGRESS NOTE    Jenna Davenport  NFA:[id number]  DOB: 1971-11-15  DOA: 11/08/2016 PCP: Temple Pacini, DO Outpatient Specialists:  Hospital course: HPI: Jenna Davenport is a 45 y.o. female with medical history significant for iron deficiency anemia, obesity status post gastric sleeve, and a rash for which she takes Plaquenil, now presenting to the emergency department for evaluation of pain under the right breast radiating around to the right flank for the past week. Patient reports that she had been in her usual state of health until developing some pain under the right breast approximately one week ago which she describes as severe, radiating around to the right flank, worse with deep inspiration or cough, and associated with exertional dyspnea. The patient denies any chest pain or palpitations and denies leg swelling or tenderness. She reports working from home and describes a very sedentary lifestyle, spending waking hours in front of a computer and going several days at a time without leaving the house. She denies any personal or family history of VTE, is not taking hormones, is a nonsmoker, and denies any recent long distance travel, surgery, or trauma. She was evaluated for these complaints a few days ago in urgent care, microscopic hematuria was noted on urinalysis, patient was diagnosed with probable kidney stones and prescribed hydrocodone. Symptoms continued to progress despite this and so she now presents to the ED.  Assessment & Plan:   1. Acute PE / DVT left popliteal - Presents with pleuritic pain, tachycardia, dyspnea  - CTA study is limited by bolus timing, but reveals segmental PE in left lung and radiology expressed suspicion for right-sided PE as well  - Possible inciting factors include obesity and very sedentary lifestyle;  - BP has remained stable in ED  - Transition from lovenox to oral eliquis.  I discussed and counseled with patient and this was her choice.  I asked the  pharmacist to assist with dosing and education, counseling of patient.  Her fiance was also present for the education.     2. Microscopic hematuria  - Identified at an urgent care last week and again present on admission  - CT renal stone study negative for stones or obstruction; simple renal cyst noted, but no evidence for malignancy   - She is a non-smoker   3. Rash  - Pt has been under treatment with Plaquenil for a rash, but is unsure of diagnosis  - Follow up with dermatologist  4. Iron-deficiency anemia - Hgb is 12.8 on admission  - Continue iron-supplementation   DVT prophylaxis: Lovenox per VTE treatment dosing Code Status: Full  Family Communication: Discussed with patient Disposition Plan: Home 11/14 Consults called: None Admission status: Inpatient   Subjective: Pt says that she is starting to feel better, a little less pleuritic chest wall pain.  No SOB today.  Has started ambulating some.    Objective: Vitals:   11/09/16 0514 11/09/16 1505 11/09/16 2101 11/10/16 0548  BP: (!) 100/42 134/81 (!) 149/80 125/77  Pulse: 68 87 78 70  Resp: (!) 24 (!) 22 20 20   Temp: 98.4 F (36.9 C) 97.8 F (36.6 C) 98.3 F (36.8 C) 97.7 F (36.5 C)  TempSrc: Oral Oral Oral Oral  SpO2: (!) 87% 100% 97% 97%  Weight: 107.5 kg (237 lb)     Height:        Intake/Output Summary (Last 24 hours) at 11/10/16 1515 Last data filed at 11/09/16 1845  Gross per 24 hour  Intake  720 ml  Output                0 ml  Net              720 ml   Filed Weights   11/08/16 2150 11/09/16 0120 11/09/16 0514  Weight: 106.6 kg (235 lb) 108 kg (238 lb 1.6 oz) 107.5 kg (237 lb)    Exam:  General exam: awake, alert, no distress, cooperative.   Respiratory system: Clear. No increased work of breathing. Cardiovascular system: S1 & S2 heard, RRR. No JVD, murmurs, gallops, clicks or pedal edema. Gastrointestinal system: Abdomen is nondistended, soft and nontender. Normal bowel sounds  heard. Central nervous system: Alert and oriented. No focal neurological deficits. Extremities: no CCE.  Data Reviewed: Basic Metabolic Panel:  Recent Labs Lab 11/08/16 1740 11/09/16 0037 11/10/16 0913  NA 134* 137 138  K 4.2 4.2 3.9  CL 101 105 104  CO2 27 27 29   GLUCOSE 116* 105* 92  BUN 8 7 6   CREATININE 0.79 0.73 0.75  CALCIUM 8.7* 8.3* 8.4*   Liver Function Tests:  Recent Labs Lab 11/08/16 1740 11/10/16 0913  AST 22 183*  ALT 11* 59*  ALKPHOS 62 99  BILITOT 0.6 0.4  PROT 8.3* 7.5  ALBUMIN 3.7 3.3*    Recent Labs Lab 11/08/16 1740  LIPASE 29   No results for input(s): AMMONIA in the last 168 hours. CBC:  Recent Labs Lab 11/08/16 1740 11/10/16 0913  WBC 7.5 4.3  NEUTROABS 5.6  --   HGB 12.8 12.4  HCT 39.6 37.8  MCV 88.2 86.5  PLT 257 257   Cardiac Enzymes: No results for input(s): CKTOTAL, CKMB, CKMBINDEX, TROPONINI in the last 168 hours. CBG (last 3)   Recent Labs  11/10/16 0755  GLUCAP 82   No results found for this or any previous visit (from the past 240 hour(s)).   Studies: Ct Angio Chest Pe W Or Wo Contrast  Result Date: 11/08/2016 CLINICAL DATA:  Bibasilar pneumonia. EXAM: CT ANGIOGRAPHY CHEST WITH CONTRAST TECHNIQUE: Multidetector CT imaging of the chest was performed using the standard protocol during bolus administration of intravenous contrast. Multiplanar CT image reconstructions and MIPs were obtained to evaluate the vascular anatomy. CONTRAST:  Contrast 100 cc Isovue 370 COMPARISON:  CT abdomen pelvis from earlier today. FINDINGS: Cardiovascular: The heart is enlarged. No pericardial effusion. No thoracic aortic aneurysm. No evidence for thoracic aortic dissection. Assessment of pulmonary arteries is limited by bolus timing and breathing motion, but the patient does appear to have segmental pulmonary embolus to the lingula (see image 99 series 10 and image 117 series 8). Mediastinum/Nodes: No mediastinal lymphadenopathy. There is  no hilar lymphadenopathy. The esophagus has normal imaging features. There is no axillary lymphadenopathy. Lungs/Pleura: Right lower lobe collapse/ consolidation is associated with small right pleural effusion. Left lower lobe collapse/ consolidation noted without appreciable effusion. Upper Abdomen: Small cyst noted upper pole left kidney. Patient is status post gastric bypass. Musculoskeletal: Bone windows reveal no worrisome lytic or sclerotic osseous lesions. Review of the MIP images confirms the above findings. IMPRESSION: 1. Limited study with apparent small volume left upper lobe pulmonary embolus to segmental left lower lobe arteries. 2. Bibasilar collapse/consolidation, right greater than left with small right pleural effusion. Critical Value/emergent results were called by telephone at the time of interpretation on 11/08/2016 at 11:38 pm to Dr. Geoffery LyonsUGLAS DELO , who verbally acknowledged these results. Electronically Signed   By: Jamison OkaEric  Mansell M.D.  On: 11/08/2016 23:39   Ct Renal Stone Study  Result Date: 11/08/2016 CLINICAL DATA:  Right flank pain since last Saturday. EXAM: CT ABDOMEN AND PELVIS WITHOUT CONTRAST TECHNIQUE: Multidetector CT imaging of the abdomen and pelvis was performed following the standard protocol without IV contrast. COMPARISON:  None. FINDINGS: Lower chest: Small right effusion with patchy bibasilar airspace opacities with air bronchograms involving both lower lobes, right middle lobe and lingula. Heart is normal in size. No pericardial effusion. Hepatobiliary: Cholecystectomy. No space-occupying mass or biliary dilatation of the unenhanced liver. Pancreas: Normal Spleen: No splenomegaly. Adrenals/Urinary Tract: No adrenal mass. Simple cyst in the upper pole of the left kidney measuring 2.7 cm in diameter. There is no nephrolithiasis. Small extrarenal pelves are noted. No obstructive uropathy. Bladder is physiologically distended. Stomach/Bowel: Status post bariatric surgery  without bowel obstruction or acute inflammation. Moderate amount of fecal residue within large bowel. Vascular/Lymphatic: Aortoiliac atherosclerosis. No aneurysm. No adenopathy. Reproductive: Dominant follicle noted of the left ovary. This measures approximately 2.7 cm. Uterus is unremarkable. Other: Small phleboliths are seen in the left hemipelvis. Musculoskeletal: T6 through T11 mild disc space narrowing without acute osseous abnormality. Lower lumbar facet hypertrophy most pronounced from L4-5 through L5-S1 bilaterally. These contribute to mild to moderate canal stenosis at L4-5. No acute fracture nor bone destruction. IMPRESSION: Bibasilar pneumonic consolidations right greater than left with small right sided parapneumonic effusion. No obstructive uropathy.  No nephrolithiasis. 2.7 cm left upper pole simple renal cyst. Status post bariatric surgery. Status post cholecystectomy. Lower lumbar facet arthropathy L4 through S1 contributing to mild-to-moderate central canal stenosis at L4-5. Electronically Signed   By: Tollie Ethavid  Kwon M.D.   On: 11/08/2016 19:58   Scheduled Meds: . apixaban  10 mg Oral BID  . [START ON 11/17/2016] apixaban  5 mg Oral BID  . ferrous sulfate  325 mg Oral BID WC  . mouth rinse  15 mL Mouth Rinse BID  . multivitamin with minerals  1 tablet Oral Daily  . sodium chloride flush  3 mL Intravenous Q12H  . sodium chloride flush  3 mL Intravenous Q12H   Continuous Infusions:  Principal Problem:   Acute pulmonary embolism (HCC) Active Problems:   Obesity   Acute right flank pain   Tachycardia   Dyspnea on effort   Rash   Hematuria   Iron deficiency anemia  Time spent:   Standley Dakinslanford Genevive Printup, MD, FAAFP [facility] Pager [phone]  If 7PM-7AM, please contact night-coverage [email or web address] Password [email or web address] 11/10/2016, 3:15 PM    LOS: 1 day

## 2016-11-10 NOTE — Progress Notes (Signed)
ANTICOAGULATION CONSULT NOTE   Pharmacy Consult for Enoxaparin to eliquis Indication: pulmonary embolus  Allergies  Allergen Reactions  . Pollen Extract Other (See Comments)    Itchy, watery eyes     Patient Measurements: Height: 5\' 7"  (170.2 cm) Weight: 237 lb (107.5 kg) IBW/kg (Calculated) : 61.6 Heparin Dosing Weight:   Vital Signs: Temp: 97.7 F (36.5 C) (11/13 0548) Temp Source: Oral (11/13 0548) BP: 125/77 (11/13 0548) Pulse Rate: 70 (11/13 0548)  Labs:  Recent Labs  11/08/16 1740 11/09/16 0037  HGB 12.8  --   HCT 39.6  --   PLT 257  --   APTT  --  31  LABPROT  --  15.5*  INR  --  1.23  CREATININE 0.79 0.73    Estimated Creatinine Clearance: 109.8 mL/min (by C-G formula based on SCr of 0.73 mg/dL).   Medical History: Past Medical History:  Diagnosis Date  . Anemia     Medications:  Scheduled:  . apixaban  10 mg Oral BID  . [START ON 11/17/2016] apixaban  5 mg Oral BID  . ferrous sulfate  325 mg Oral BID WC  . mouth rinse  15 mL Mouth Rinse BID  . multivitamin with minerals  1 tablet Oral Daily  . sodium chloride flush  3 mL Intravenous Q12H  . sodium chloride flush  3 mL Intravenous Q12H    Assessment: 45 YOF presents with flank pain and dyspnea.  CTA revealed acute PE.  LE dopplers reveal DVT in RLE.  Patient initially started on LMWH 11/12am then orders for pharmacy to dose apixaban.   Today, 11/10/2016  Renal: SCr WNL  CBC: WBC WNL  No significant drug-drug interactions noted currently  Goal of Therapy:  Monitor platelets by anticoagulation protocol: Yes   Plan:   Apixaban 10mg  PO BID x 7 days then 5mg  PO BID to complete therapy  Monitor for bleeding  Pharmacist to provide coupon card for free 30-day supply and provide medication education  Juliette Alcideustin Arlyss Weathersby, PharmD, BCPS.   Pager: [phone] 11/10/2016 10:29 AM

## 2016-11-10 NOTE — Discharge Instructions (Addendum)
Information on my medicine - ELIQUIS (apixaban)  This medication education was reviewed with me or my healthcare representative as part of my discharge preparation.  The pharmacist that spoke with me during my hospital stay was:  Ulyses Amor. PharmD. Why was Eliquis prescribed for you? Eliquis was prescribed to treat blood clots that may have been found in the veins of your legs (deep vein thrombosis) or in your lungs (pulmonary embolism) and to reduce the risk of them occurring again.  What do You need to know about Eliquis ? The starting dose is 10 mg (two 5 mg tablets) taken TWICE daily for the FIRST SEVEN (7) DAYS, then on 11/17/2016  the dose is reduced to ONE 5 mg tablet taken TWICE daily.  Eliquis may be taken with or without food.   Try to take the dose about the same time in the morning and in the evening. If you have difficulty swallowing the tablet whole please discuss with your pharmacist how to take the medication safely.  Take Eliquis exactly as prescribed and DO NOT stop taking Eliquis without talking to the doctor who prescribed the medication.  Stopping may increase your risk of developing a new blood clot.  Refill your prescription before you run out.  After discharge, you should have regular check-up appointments with your healthcare provider that is prescribing your Eliquis.    What do you do if you miss a dose? If a dose of ELIQUIS is not taken at the scheduled time, take it as soon as possible on the same day and twice-daily administration should be resumed. The dose should not be doubled to make up for a missed dose.  Important Safety Information A possible side effect of Eliquis is bleeding. You should call your healthcare provider right away if you experience any of the following: ? Bleeding from an injury or your nose that does not stop. ? Unusual colored urine (red or dark brown) or unusual colored stools (red or black). ? Unusual bruising for unknown  reasons. ? A serious fall or if you hit your head (even if there is no bleeding).  Some medicines may interact with Eliquis and might increase your risk of bleeding or clotting while on Eliquis. To help avoid this, consult your healthcare provider or pharmacist prior to using any new prescription or non-prescription medications, including herbals, vitamins, non-steroidal anti-inflammatory drugs (NSAIDs) and supplements.  This website has more information on Eliquis (apixaban): [email or web address]://[email or web address].[email or web address]/home     Pulmonary Embolism A pulmonary embolism (PE) is a sudden blockage or decrease of blood flow in one lung or both lungs. Most blockages come from a blood clot that travels from the legs or the pelvis to the lungs. PE is a dangerous and potentially life-threatening condition if it is not treated right away. What are the causes? A pulmonary embolism occurs most commonly when a blood clot travels from one of your veins to your lungs. Rarely, PE is caused by air, fat, amniotic fluid, or part of a tumor traveling through your veins to your lungs. What increases the risk? A PE is more likely to develop in:  People who smoke.  People who areolder, especially over 50 years of age.  People who are overweight (obese).  People who sit or lie still for a long time, such as during long-distance travel (over 4 hours), bed rest, hospitalization, or during recovery from certain medical conditions like a stroke.  People who do not engage in much physical activity (sedentary lifestyle).  People  who have chronic breathing disorders.  People whohave a personal or family history of blood clots or blood clotting disease.  People whohave peripheral vascular disease (PVD), diabetes, or some types of cancer.  People who haveheart disease, especially if the person had a recent heart attack or has congestive heart failure.  People who have neurological diseases that affect the legs (leg  paresis).  People who have had a traumatic injury, such as breaking a hip or leg.  People whohave recently had major or lengthy surgery, especially on the hip, knee, or abdomen.  People who have hada central line placed inside a large vein.  People who takemedicines that contain the hormone estrogen. These include birth control pills and hormone replacement therapy.  Pregnancy or during childbirth or the postpartum period. What are the signs or symptoms? The symptoms of a PE usually start suddenly and include:  Shortness of breath while active or at rest.  Coughing or coughing up blood or blood-tinged mucus.  Chest pain that is often worse with deep breaths.  Rapid or irregular heartbeat.  Feeling light-headed or dizzy.  Fainting.  Feelinganxious.  Sweating. There may also be pain and swelling in a leg if that is where the blood clot started. These symptoms may represent a serious problem that is an emergency. Do not wait to see if the symptoms will go away. Get medical help right away. Call your local emergency services (911 in the [place] Do not drive yourself to the hospital.  How is this diagnosed? Your health care provider will take a medical history and perform a physical exam. You may also have other tests, including:  Blood tests to assess the clotting properties of your blood, assess oxygen levels in your blood, and find blood clots.  Imaging tests, such as CT, ultrasound, MRI, X-ray, and other tests to see if you have clots anywhere in your body.  An electrocardiogram (ECG) to look for heart strain from blood clots in the lungs. How is this treated? The main goals of PE treatment are:  To stop a blood clot from growing larger.  To stop new blood clots from forming. The type of treatment that you receive depends on many factors, such as the cause of your PE, your risk for bleeding or developing more clots, and other medical conditions that you have. Sometimes,  a combination of treatments is necessary. This condition may be treated with:  Medicines, including newer oral blood thinners (anticoagulants), warfarin, low molecular weight heparins, thrombolytics, or heparins.  Wearing compression stockings or using different types of devices.  Surgery (rare) to remove the blood clot or to place a filter in your abdomen to stop the blood clot from traveling to your lungs. Treatments for a PE are often divided into immediate treatment, long-term treatment (up to 3 months after PE), and extended treatment (more than 3 months after PE). Your treatment may continue for several months. This is called maintenance therapy, and it is used to prevent the forming of new blood clots. You can work with your health care provider to choose the treatment program that is best for you. What are anticoagulants?  Anticoagulants are medicines that treat PEs. They can stop current blood clots from growing and stop new clots from forming. They cannot dissolve existing clots. Your body dissolves clots by itself over time. Anticoagulants are given by mouth, by injection, or through an IV tube. What are thrombolytics?  Thrombolytics are clot-dissolving medicines that are used to dissolve a PE.  They carry a high risk of bleeding, so they tend to be used only in severe cases or if you have very low blood pressure. Follow these instructions at home: If you are taking a newer oral anticoagulant:  Take the medicine every single day at the same time each day.  Understand what foods and drugs interact with this medicine.  Understand that there are no regular blood tests required when using this medicine.  Understandthe side effects of this medicine, including excessive bruising or bleeding. Ask your health care provider or pharmacist about other possible side effects. If you are taking warfarin:  Understand how to take warfarin and know which foods can affect how warfarin works in [employer] account planner.  Understand that it is dangerous to taketoo much or too little warfarin. Too much warfarin increases the risk of bleeding. Too little warfarin continues to allow the risk for blood clots.  Follow your PT and INR blood testing schedule. The PT and INR results allow your health care provider to adjust your dose of warfarin. It is very important that you have your PT and INR tested as often as told by your health care provider.  Avoid major changes in your diet, or tell your health care provider before you change your diet. Arrange a visit with a registered dietitian to answer your questions. Many foods, especially foods that are high in vitamin K, can interfere with warfarin and affect the PT and INR results. Eat a consistent amount of foods that are high in vitamin K, such as:  Spinach, kale, broccoli, cabbage, collard greens, turnip greens, Brussels sprouts, peas, cauliflower, seaweed, and parsley.  Beef liver and pork liver.  Green tea.  Soybean oil.  Tell your health care provider about any and all medicines, vitamins, and supplements that you take, including aspirin and other over-the-counter anti-inflammatory medicines. Be especially cautious with aspirin and anti-inflammatory medicines. Do not take those before you ask your health care provider if it is safe to do so. This is important because many medicines can interfere with warfarin and affect the PT and INR results.  Do not start or stop taking any over-the-counter or prescription medicine unless your health care provider or pharmacist tells you to do so. If you take warfarin, you will also need to do these things:  Hold pressure over cuts for longer than usual.  Tell your dentist and other health care providers that you are taking warfarin before you have any procedures in which bleeding may occur.  Avoid alcohol or drink very small amounts. Tell your health care provider if you change your alcohol intake.  Do not use  tobacco products, including cigarettes, chewing tobacco, and e-cigarettes. If you need help quitting, ask your health care provider.  Avoid contact sports. General instructions  Take over-the-counter and prescription medicines only as told by your health care provider. Anticoagulant medicines can have side effects, including easy bruising and difficulty stopping bleeding. If you are prescribed an anticoagulant, you will also need to do these things:  Hold pressure over cuts for longer than usual.  Tell your dentist and other health care providers that you are taking anticoagulants before you have any procedures in which bleeding may occur.  Avoid contact sports.  Wear a medical alert bracelet or carry a medical alert card that says you have had a PE.  Ask your health care provider how soon you can go back to your normal activities. Stay active to prevent new blood clots from forming.  Make sure to exercise while traveling or when you have been sitting or standing for a long period of time. It is very important to exercise. Exercise your legs by walking or by tightening and relaxing your leg muscles often. Take frequent walks.  Wear compression stockings as told by your health care provider to help prevent more blood clots from forming.  Do not use tobacco products, including cigarettes, chewing tobacco, and e-cigarettes. If you need help quitting, ask your health care provider.  Keep all follow-up appointments with your health care provider. This is important. How is this prevented? Take these actions to decrease your risk of developing another PE:  Exercise regularly. For at least 30 minutes every day, engage in:  Activity that involves moving your arms and legs.  Activity that encourages good blood flow through your body by increasing your heart rate.  Exercise your arms and legs every hour during long-distance travel (over 4 hours). Drink plenty of water and avoid drinking alcohol  while traveling.  Avoid sitting or lying in bed for long periods of time without moving your legs.  Maintain a weight that is appropriate for your height. Ask your health care provider what weight is healthy for you.  If you are a woman who is over 45 years of age, avoid unnecessary use of medicines that contain estrogen. These include birth control pills.  Do not smoke, especially if you take estrogen medicines. If you need help quitting, ask your health care provider.  If you are at very high risk for PE, wear compression stockings.  If you recently had a PE, have regularly scheduled ultrasound testing on your legs to check for new blood clots. If you are hospitalized, prevention measures may include:  Early walking after surgery, as soon as your health care provider says that it is safe.  Receiving anticoagulants to prevent blood clots. If you cannot take anticoagulants, other options may be available, such as wearing compression stockings or using different types of devices. Get help right away if:  You have new or increased pain, swelling, or redness in an arm or leg.  You have numbness or tingling in an arm or leg.  You have shortness of breath while active or at rest.  You have chest pain.  You have a rapid or irregular heartbeat.  You feel light-headed or dizzy.  You cough up blood.  You notice blood in your vomit, bowel movement, or urine.  You have a fever. These symptoms may represent a serious problem that is an emergency. Do not wait to see if the symptoms will go away. Get medical help right away. Call your local emergency services (911 in the [place] Do not drive yourself to the hospital.  This information is not intended to replace advice given to you by your health care provider. Make sure you discuss any questions you have with your health care provider. Document Released: 12/12/2000 Document Revised: 05/22/2016 Document Reviewed: 04/11/2015 [employer]  Interactive Patient Education  2017 [employer]    Deep Vein Thrombosis A deep vein thrombosis (DVT) is a blood clot (thrombus) that usually occurs in a deep, larger vein of the lower leg or the pelvis, or in an upper extremity such as the arm. These are dangerous and can lead to serious and even life-threatening complications if the clot travels to the lungs. A DVT can damage the valves in your leg veins so that instead of flowing upward, the blood pools in the lower leg. This is  called post-thrombotic syndrome, and it can result in pain, swelling, discoloration, and sores on the leg. What are the causes? A DVT is caused by the formation of a blood clot in your leg, pelvis, or arm. Usually, several things contribute to the formation of blood clots. A clot may develop when:  Your blood flow slows down.  Your vein becomes damaged in some way.  You have a condition that makes your blood clot more easily. What increases the risk? A DVT is more likely to develop in:  People who are older, especially over 47 years of age.  People who are overweight (obese).  People who sit or lie still for a long time, such as during long-distance travel (over 4 hours), bed rest, hospitalization, or during recovery from certain medical conditions like a stroke.  People who do not engage in much physical activity (sedentary lifestyle).  People who have chronic breathing disorders.  People who have a personal or family history of blood clots or blood clotting disease.  People who have peripheral vascular disease (PVD), diabetes, or some types of cancer.  People who have heart disease, especially if the person had a recent heart attack or has congestive heart failure.  People who have neurological diseases that affect the legs (leg paresis).  People who have had a traumatic injury, such as breaking a hip or leg.  People who have recently had major or lengthy surgery, especially on the hip, knee, or  abdomen.  People who have had a central line placed inside a large vein.  People who take medicines that contain the hormone estrogen. These include birth control pills and hormone replacement therapy.  Pregnancy or during childbirth or the postpartum period.  Long plane flights (over 8 hours). What are the signs or symptoms?   Symptoms of a DVT can include:  Swelling of your leg or arm, especially if one side is much worse.  Warmth and redness of your leg or arm, especially if one side is much worse.  Pain in your arm or leg. If the clot is in your leg, symptoms may be more noticeable or worse when you stand or walk.  A feeling of pins and needles, if the clot is in the arm. The symptoms of a DVT that has traveled to the lungs (pulmonary embolism, PE) usually start suddenly and include:  Shortness of breath while active or at rest.  Coughing or coughing up blood or blood-tinged mucus.  Chest pain that is often worse with deep breaths.  Rapid or irregular heartbeat.  Feeling light-headed or dizzy.  Fainting.  Feeling anxious.  Sweating. There may also be pain and swelling in a leg if that is where the blood clot started. These symptoms may represent a serious problem that is an emergency. Do not wait to see if the symptoms will go away. Get medical help right away. Call your local emergency services (911 in the [place] Do not drive yourself to the hospital.  How is this diagnosed? Your health care provider will take a medical history and perform a physical exam. You may also have other tests, including:  Blood tests to assess the clotting properties of your blood.  Imaging tests, such as CT, ultrasound, MRI, X-ray, and other tests to see if you have clots anywhere in your body. How is this treated? After a DVT is identified, it can be treated. The type of treatment that you receive depends on many factors, such as the cause of your DVT,  your risk for bleeding or  developing more clots, and other medical conditions that you have. Sometimes, a combination of treatments is necessary. Treatment options may be combined and include:  Monitoring the blood clot with ultrasound.  Taking medicines by mouth, such as newer blood thinners (anticoagulants), thrombolytics, or warfarin.  Taking anticoagulant medicine by injection or through an IV tube.  Wearing compression stockings or using different types ofdevices.  Surgery (rare) to remove the blood clot or to place a filter in your abdomen to stop the blood clot from traveling to your lungs. Treatments for a DVT are often divided into immediate treatment and long-term treatment (up to 3 months after DVT). You can work with your health care provider to choose the treatment program that is best for you. Follow these instructions at home: If you are taking a newer oral anticoagulant:  Take the medicine every single day at the same time each day.  Understand what foods and drugs interact with this medicine.  Understand that there are no regular blood tests required when using this medicine.  Understand the side effects of this medicine, including excessive bruising or bleeding. Ask your health care provider or pharmacist about other possible side effects. If you are taking warfarin:  Understand how to take warfarin and know which foods can affect how warfarin works in Public relations account executive.  Understand that it is dangerous to take too much or too little warfarin. Too much warfarin increases the risk of bleeding. Too little warfarin continues to allow the risk for blood clots.  Follow your PT and INR blood testing schedule. The PT and INR results allow your health care provider to adjust your dose of warfarin. It is very important that you have your PT and INR tested as often as told by your health care provider.  Avoid major changes in your diet, or tell your health care provider before you change your diet. Arrange a  visit with a registered dietitian to answer your questions. Many foods, especially foods that are high in vitamin K, can interfere with warfarin and affect the PT and INR results. Eat a consistent amount of foods that are high in vitamin K, such as:  Spinach, kale, broccoli, cabbage, collard greens, turnip greens, Brussels sprouts, peas, cauliflower, seaweed, and parsley.  Beef liver and pork liver.  Green tea.  Soybean oil.  Tell your health care provider about any and all medicines, vitamins, and supplements that you take, including aspirin and other over-the-counter anti-inflammatory medicines. Be especially cautious with aspirin and anti-inflammatory medicines. Do not take those before you ask your health care provider if it is safe to do so. This is important because many medicines can interfere with warfarin and affect the PT and INR results.  Do not start or stop taking any over-the-counter or prescription medicine unless your health care provider or pharmacist tells you to do so. If you take warfarin, you will also need to do these things:  Hold pressure over cuts for longer than usual.  Tell your dentist and other health care providers that you are taking warfarin before you have any procedures in which bleeding may occur.  Avoid alcohol or drink very small amounts. Tell your health care provider if you change your alcohol intake.  Do not use tobacco products, including cigarettes, chewing tobacco, and e-cigarettes. If you need help quitting, ask your health care provider.  Avoid contact sports. General instructions  Take over-the-counter and prescription medicines only as told by your health care  provider. Anticoagulant medicines can have side effects, including easy bruising and difficulty stopping bleeding. If you are prescribed an anticoagulant, you will also need to do these things:  Hold pressure over cuts for longer than usual.  Tell your dentist and other health care  providers that you are taking anticoagulants before you have any procedures in which bleeding may occur.  Avoid contact sports.  Wear a medical alert bracelet or carry a medical alert card that says you have had a PE.  Ask your health care provider how soon you can go back to your normal activities. Stay active to prevent new blood clots from forming.  Make sure to exercise while traveling or when you have been sitting or standing for a long period of time. It is very important to exercise. Exercise your legs by walking or by tightening and relaxing your leg muscles often. Take frequent walks.  Wear compression stockings as told by your health care provider to help prevent more blood clots from forming.  Do not use tobacco products, including cigarettes, chewing tobacco, and e-cigarettes. If you need help quitting, ask your health care provider.  Keep all follow-up appointments with your health care provider. This is important. How is this prevented? Take these actions to decrease your risk of developing another DVT:  Exercise regularly. For at least 30 minutes every day, engage in:  Activity that involves moving your arms and legs.  Activity that encourages good blood flow through your body by increasing your heart rate.  Exercise your arms and legs every hour during long-distance travel (over 4 hours). Drink plenty of water and avoid drinking alcohol while traveling.  Avoid sitting or lying in bed for long periods of time without moving your legs.  Maintain a weight that is appropriate for your height. Ask your health care provider what weight is healthy for you.  If you are a woman who is over 26 years of age, avoid unnecessary use of medicines that contain estrogen. These include birth control pills.  Do not smoke, especially if you take estrogen medicines. If you need help quitting, ask your health care provider. If you are hospitalized, prevention measures may include:  Early  walking after surgery, as soon as your health care provider says that it is safe.  Receiving anticoagulants to prevent blood clots.If you cannot take anticoagulants, other options may be available, such as wearing compression stockings or using different types of devices. Get help right away if:  You have new or increased pain, swelling, or redness in an arm or leg.  You have numbness or tingling in an arm or leg.  You have shortness of breath while active or at rest.  You have chest pain.  You have a rapid or irregular heartbeat.  You feel light-headed or dizzy.  You cough up blood.  You notice blood in your vomit, bowel movement, or urine. These symptoms may represent a serious problem that is an emergency. Do not wait to see if the symptoms will go away. Get medical help right away. Call your local emergency services (911 in the [place] Do not drive yourself to the hospital.  This information is not intended to replace advice given to you by your health care provider. Make sure you discuss any questions you have with your health care provider. Document Released: 12/15/2005 Document Revised: 05/22/2016 Document Reviewed: 04/11/2015 Elsevier Interactive Patient Education  2017 [employer] Inc[employer]    Heart Disease Prevention Heart disease is a leading cause of death.  There are many things you can do to help prevent heart disease. Be physically active Physical activity is good for your heart. It helps control your blood pressure, cholesterol levels, and weight. Try to be physically active every day. Ask your health care provider what activities are best for you. Be a healthy weight Extra weight can strain your heart and affect your blood pressure and cholesterol levels. Lose weight with diet and exercise if recommended by your health care provider. Eat heart-healthy foods Follow a healthy eating plan as recommended by your health care provider or dietitian. Heart-healthy foods  include:  High-fiber foods. These include oat bran, oatmeal, and whole-grain breads and cereals.  Fruits and vegetables. Avoid:  Alcohol.  Fried foods.  Foods high in saturated fat. These include meats, butter, whole dairy products, shortening, and coconut or palm oil.  Salty foods. These include canned food, luncheon meat, salty snacks, and fast food. Keep your cholesterol levels under control Cholesterol is a substance that is used for many important functions. When your cholesterol levels are high, cholesterol can stick to the insides of your blood vessels, making them narrow or clog. This can lead to chest pain (angina) and a heart attack. Keep your cholesterol levels under control as recommended by your health care provider. Have your cholesterol checked at least once a year. Target cholesterol levels (in mg/dL) for most people are:  Total cholesterol below 200.  LDL cholesterol below 100.  HDL cholesterol above 40 in men and above 50 in women.  Triglycerides below 150. Keep your blood pressure under control Having high blood pressure (hypertension) puts you at risk for stroke and other forms of heart disease. Keep your blood pressure under control as recommended by your health care provider. Ask your health care provider if you need treatment to lower your blood pressure. If you are 90-50 years of age, have your blood pressure checked every 3-5 years. If you are 12 years of age or older, have your blood pressure checked every year. Do not use tobacco products Tobacco smoke can damage your heart and blood vessels. Do not use any tobacco products including cigarettes, chewing tobacco, or electronic cigarettes. If you need help quitting, ask your health care provider. Take medicines as directed Take medicines only as directed by your health care provider. Ask your health care provider whether you should take an aspirin every day. Taking aspirin can help reduce your risk of heart  disease and stroke. Where to find more information: To find out more about heart disease, visit the American Heart Association's website at www.americanheart.org This information is not intended to replace advice given to you by your health care provider. Make sure you discuss any questions you have with your health care provider. Document Released: 07/29/2004 Document Revised: 05/14/2016 Document Reviewed: 02/08/2014 Elsevier Interactive Patient Education  2017 [employer] Inc.   Deep Vein Thrombosis A deep vein thrombosis (DVT) is a blood clot (thrombus) that usually occurs in a deep, larger vein of the lower leg or the pelvis, or in an upper extremity such as the arm. These are dangerous and can lead to serious and even life-threatening complications if the clot travels to the lungs. A DVT can damage the valves in your leg veins so that instead of flowing upward, the blood pools in the lower leg. This is called post-thrombotic syndrome, and it can result in pain, swelling, discoloration, and sores on the leg. What are the causes? A DVT is caused by the formation of a  blood clot in your leg, pelvis, or arm. Usually, several things contribute to the formation of blood clots. A clot may develop when:  Your blood flow slows down.  Your vein becomes damaged in some way.  You have a condition that makes your blood clot more easily. What increases the risk? A DVT is more likely to develop in:  People who are older, especially over 68 years of age.  People who are overweight (obese).  People who sit or lie still for a long time, such as during long-distance travel (over 4 hours), bed rest, hospitalization, or during recovery from certain medical conditions like a stroke.  People who do not engage in much physical activity (sedentary lifestyle).  People who have chronic breathing disorders.  People who have a personal or family history of blood clots or blood clotting disease.  People who  have peripheral vascular disease (PVD), diabetes, or some types of cancer.  People who have heart disease, especially if the person had a recent heart attack or has congestive heart failure.  People who have neurological diseases that affect the legs (leg paresis).  People who have had a traumatic injury, such as breaking a hip or leg.  People who have recently had major or lengthy surgery, especially on the hip, knee, or abdomen.  People who have had a central line placed inside a large vein.  People who take medicines that contain the hormone estrogen. These include birth control pills and hormone replacement therapy.  Pregnancy or during childbirth or the postpartum period.  Long plane flights (over 8 hours). What are the signs or symptoms?   Symptoms of a DVT can include:  Swelling of your leg or arm, especially if one side is much worse.  Warmth and redness of your leg or arm, especially if one side is much worse.  Pain in your arm or leg. If the clot is in your leg, symptoms may be more noticeable or worse when you stand or walk.  A feeling of pins and needles, if the clot is in the arm. The symptoms of a DVT that has traveled to the lungs (pulmonary embolism, PE) usually start suddenly and include:  Shortness of breath while active or at rest.  Coughing or coughing up blood or blood-tinged mucus.  Chest pain that is often worse with deep breaths.  Rapid or irregular heartbeat.  Feeling light-headed or dizzy.  Fainting.  Feeling anxious.  Sweating. There may also be pain and swelling in a leg if that is where the blood clot started. These symptoms may represent a serious problem that is an emergency. Do not wait to see if the symptoms will go away. Get medical help right away. Call your local emergency services (911 in the [place] Do not drive yourself to the hospital.  How is this diagnosed? Your health care provider will take a medical history and perform a  physical exam. You may also have other tests, including:  Blood tests to assess the clotting properties of your blood.  Imaging tests, such as CT, ultrasound, MRI, X-ray, and other tests to see if you have clots anywhere in your body. How is this treated? After a DVT is identified, it can be treated. The type of treatment that you receive depends on many factors, such as the cause of your DVT, your risk for bleeding or developing more clots, and other medical conditions that you have. Sometimes, a combination of treatments is necessary. Treatment options may be combined and  include:  Monitoring the blood clot with ultrasound.  Taking medicines by mouth, such as newer blood thinners (anticoagulants), thrombolytics, or warfarin.  Taking anticoagulant medicine by injection or through an IV tube.  Wearing compression stockings or using different types ofdevices.  Surgery (rare) to remove the blood clot or to place a filter in your abdomen to stop the blood clot from traveling to your lungs. Treatments for a DVT are often divided into immediate treatment and long-term treatment (up to 3 months after DVT). You can work with your health care provider to choose the treatment program that is best for you. Follow these instructions at home: If you are taking a newer oral anticoagulant:  Take the medicine every single day at the same time each day.  Understand what foods and drugs interact with this medicine.  Understand that there are no regular blood tests required when using this medicine.  Understand the side effects of this medicine, including excessive bruising or bleeding. Ask your health care provider or pharmacist about other possible side effects. If you are taking warfarin:  Understand how to take warfarin and know which foods can affect how warfarin works in [employer].  Understand that it is dangerous to take too much or too little warfarin. Too much warfarin increases the risk of  bleeding. Too little warfarin continues to allow the risk for blood clots.  Follow your PT and INR blood testing schedule. The PT and INR results allow your health care provider to adjust your dose of warfarin. It is very important that you have your PT and INR tested as often as told by your health care provider.  Avoid major changes in your diet, or tell your health care provider before you change your diet. Arrange a visit with a registered dietitian to answer your questions. Many foods, especially foods that are high in vitamin K, can interfere with warfarin and affect the PT and INR results. Eat a consistent amount of foods that are high in vitamin K, such as:  Spinach, kale, broccoli, cabbage, collard greens, turnip greens, Brussels sprouts, peas, cauliflower, seaweed, and parsley.  Beef liver and pork liver.  Green tea.  Soybean oil.  Tell your health care provider about any and all medicines, vitamins, and supplements that you take, including aspirin and other over-the-counter anti-inflammatory medicines. Be especially cautious with aspirin and anti-inflammatory medicines. Do not take those before you ask your health care provider if it is safe to do so. This is important because many medicines can interfere with warfarin and affect the PT and INR results.  Do not start or stop taking any over-the-counter or prescription medicine unless your health care provider or pharmacist tells you to do so. If you take warfarin, you will also need to do these things:  Hold pressure over cuts for longer than usual.  Tell your dentist and other health care providers that you are taking warfarin before you have any procedures in which bleeding may occur.  Avoid alcohol or drink very small amounts. Tell your health care provider if you change your alcohol intake.  Do not use tobacco products, including cigarettes, chewing tobacco, and e-cigarettes. If you need help quitting, ask your health care  provider.  Avoid contact sports. General instructions  Take over-the-counter and prescription medicines only as told by your health care provider. Anticoagulant medicines can have side effects, including easy bruising and difficulty stopping bleeding. If you are prescribed an anticoagulant, you will also need to do these things:  Hold pressure over cuts for longer than usual.  Tell your dentist and other health care providers that you are taking anticoagulants before you have any procedures in which bleeding may occur.  Avoid contact sports.  Wear a medical alert bracelet or carry a medical alert card that says you have had a PE.  Ask your health care provider how soon you can go back to your normal activities. Stay active to prevent new blood clots from forming.  Make sure to exercise while traveling or when you have been sitting or standing for a long period of time. It is very important to exercise. Exercise your legs by walking or by tightening and relaxing your leg muscles often. Take frequent walks.  Wear compression stockings as told by your health care provider to help prevent more blood clots from forming.  Do not use tobacco products, including cigarettes, chewing tobacco, and e-cigarettes. If you need help quitting, ask your health care provider.  Keep all follow-up appointments with your health care provider. This is important. How is this prevented? Take these actions to decrease your risk of developing another DVT:  Exercise regularly. For at least 30 minutes every day, engage in:  Activity that involves moving your arms and legs.  Activity that encourages good blood flow through your body by increasing your heart rate.  Exercise your arms and legs every hour during long-distance travel (over 4 hours). Drink plenty of water and avoid drinking alcohol while traveling.  Avoid sitting or lying in bed for long periods of time without moving your legs.  Maintain a weight  that is appropriate for your height. Ask your health care provider what weight is healthy for you.  If you are a woman who is over 70 years of age, avoid unnecessary use of medicines that contain estrogen. These include birth control pills.  Do not smoke, especially if you take estrogen medicines. If you need help quitting, ask your health care provider. If you are hospitalized, prevention measures may include:  Early walking after surgery, as soon as your health care provider says that it is safe.  Receiving anticoagulants to prevent blood clots.If you cannot take anticoagulants, other options may be available, such as wearing compression stockings or using different types of devices. Get help right away if:  You have new or increased pain, swelling, or redness in an arm or leg.  You have numbness or tingling in an arm or leg.  You have shortness of breath while active or at rest.  You have chest pain.  You have a rapid or irregular heartbeat.  You feel light-headed or dizzy.  You cough up blood.  You notice blood in your vomit, bowel movement, or urine. These symptoms may represent a serious problem that is an emergency. Do not wait to see if the symptoms will go away. Get medical help right away. Call your local emergency services (911 in the [place] Do not drive yourself to the hospital.  This information is not intended to replace advice given to you by your health care provider. Make sure you discuss any questions you have with your health care provider. Document Released: 12/15/2005 Document Revised: 05/22/2016 Document Reviewed: 04/11/2015 Elsevier Interactive Patient Education  2017 [employer] Inc.    Hypertension Hypertension is another name for high blood pressure. High blood pressure forces your heart to work harder to pump blood. A blood pressure reading has two numbers, which includes a higher number over a lower number (example: 110/72). Follow these  instructions at  home:  Have your blood pressure rechecked by your doctor.  Only take medicine as told by your doctor. Follow the directions carefully. The medicine does not work as well if you skip doses. Skipping doses also puts you at risk for problems.  Do not smoke.  Monitor your blood pressure at home as told by your doctor. Contact a doctor if:  You think you are having a reaction to the medicine you are taking.  You have repeat headaches or feel dizzy.  You have puffiness (swelling) in your ankles.  You have trouble with your vision. Get help right away if:  You get a very bad headache and are confused.  You feel weak, numb, or faint.  You get chest or belly (abdominal) pain.  You throw up (vomit).  You cannot breathe very well. This information is not intended to replace advice given to you by your health care provider. Make sure you discuss any questions you have with your health care provider. Document Released: 06/02/2008 Document Revised: 05/22/2016 Document Reviewed: 10/07/2013 Elsevier Interactive Patient Education  2017 [employer] Inc.   DASH Eating Plan DASH stands for "Dietary Approaches to Stop Hypertension." The DASH eating plan is a healthy eating plan that has been shown to reduce high blood pressure (hypertension). Additional health benefits may include reducing the risk of type 2 diabetes mellitus, heart disease, and stroke. The DASH eating plan may also help with weight loss. What do I need to know about the DASH eating plan? For the DASH eating plan, you will follow these general guidelines:  Choose foods with less than 150 milligrams of sodium per serving (as listed on the food label).  Use salt-free seasonings or herbs instead of table salt or sea salt.  Check with your health care provider or pharmacist before using salt substitutes.  Eat lower-sodium products. These are often labeled as "low-sodium" or "no salt added."  Eat fresh foods. Avoid eating a lot of  canned foods.  Eat more vegetables, fruits, and low-fat dairy products.  Choose whole grains. Look for the word "whole" as the first word in the ingredient list.  Choose fish and skinless chicken or [place] more often than red meat. Limit fish, poultry, and meat to 6 oz (170 g) each day.  Limit sweets, desserts, sugars, and sugary drinks.  Choose heart-healthy fats.  Eat more home-cooked food and less restaurant, buffet, and fast food.  Limit fried foods.  Do not fry foods. Cook foods using methods such as baking, boiling, grilling, and broiling instead.  When eating at a restaurant, ask that your food be prepared with less salt, or no salt if possible. What foods can I eat? Seek help from a dietitian for individual calorie needs. Grains  Whole grain or whole wheat bread. Brown rice. Whole grain or whole wheat pasta. Quinoa, bulgur, and whole grain cereals. Low-sodium cereals. Corn or whole wheat flour tortillas. Whole grain cornbread. Whole grain crackers. Low-sodium crackers. Vegetables  Fresh or frozen vegetables (raw, steamed, roasted, or grilled). Low-sodium or reduced-sodium tomato and vegetable juices. Low-sodium or reduced-sodium tomato sauce and paste. Low-sodium or reduced-sodium canned vegetables. Fruits  All fresh, canned (in natural juice), or frozen fruits. Meat and Other Protein Products  Ground beef (85% or leaner), grass-fed beef, or beef trimmed of fat. Skinless chicken or [place]. Ground chicken or [place]. Pork trimmed of fat. All fish and seafood. Eggs. Dried beans, peas, or lentils. Unsalted nuts and seeds. Unsalted canned beans. Dairy  Low-fat  dairy products, such as skim or 1% milk, 2% or reduced-fat cheeses, low-fat ricotta or cottage cheese, or plain low-fat yogurt. Low-sodium or reduced-sodium cheeses. Fats and Oils  Tub margarines without trans fats. Light or reduced-fat mayonnaise and salad dressings (reduced sodium). Avocado. Safflower, olive, or canola  oils. Natural peanut or almond butter. Other  Unsalted popcorn and pretzels. The items listed above may not be a complete list of recommended foods or beverages. Contact your dietitian for more options.  What foods are not recommended? Grains  White bread. White pasta. White rice. Refined cornbread. Bagels and croissants. Crackers that contain trans fat. Vegetables  Creamed or fried vegetables. Vegetables in a cheese sauce. Regular canned vegetables. Regular canned tomato sauce and paste. Regular tomato and vegetable juices. Fruits  Canned fruit in light or heavy syrup. Fruit juice. Meat and Other Protein Products  Fatty cuts of meat. Ribs, chicken wings, bacon, sausage, bologna, salami, chitterlings, fatback, hot dogs, bratwurst, and packaged luncheon meats. Salted nuts and seeds. Canned beans with salt. Dairy  Whole or 2% milk, cream, half-and-half, and cream cheese. Whole-fat or sweetened yogurt. Full-fat cheeses or blue cheese. Nondairy creamers and whipped toppings. Processed cheese, cheese spreads, or cheese curds. Condiments  Onion and garlic salt, seasoned salt, table salt, and sea salt. Canned and packaged gravies. Worcestershire sauce. Tartar sauce. Barbecue sauce. Teriyaki sauce. Soy sauce, including reduced sodium. Steak sauce. Fish sauce. Oyster sauce. Cocktail sauce. Horseradish. Ketchup and mustard. Meat flavorings and tenderizers. Bouillon cubes. Hot sauce. Tabasco sauce. Marinades. Taco seasonings. Relishes. Fats and Oils  Butter, stick margarine, lard, shortening, ghee, and bacon fat. Coconut, palm kernel, or palm oils. Regular salad dressings. Other  Pickles and olives. Salted popcorn and pretzels. The items listed above may not be a complete list of foods and beverages to avoid. Contact your dietitian for more information.  Where can I find more information? National Heart, Lung, and Blood Institute: [employer] This information is  not intended to replace advice given to you by your health care provider. Make sure you discuss any questions you have with your health care provider. Document Released: 12/04/2011 Document Revised: 05/22/2016 Document Reviewed: 10/19/2013 Elsevier Interactive Patient Education  2017 [employer].

## 2016-11-11 DIAGNOSIS — I82432 Acute embolism and thrombosis of left popliteal vein: Secondary | ICD-10-CM | POA: Diagnosis present

## 2016-11-11 DIAGNOSIS — I10 Essential (primary) hypertension: Secondary | ICD-10-CM | POA: Diagnosis present

## 2016-11-11 LAB — CBC
HCT: 34.2 % — ABNORMAL LOW (ref 36.0–46.0)
Hemoglobin: 11 g/dL — ABNORMAL LOW (ref 12.0–15.0)
MCH: 27.8 pg (ref 26.0–34.0)
MCHC: 32.2 g/dL (ref 30.0–36.0)
MCV: 86.6 fL (ref 78.0–100.0)
Platelets: 245 10*3/uL (ref 150–400)
RBC: 3.95 MIL/uL (ref 3.87–5.11)
RDW: 14.2 % (ref 11.5–15.5)
WBC: 3.8 10*3/uL — ABNORMAL LOW (ref 4.0–10.5)

## 2016-11-11 LAB — GLUCOSE, CAPILLARY: Glucose-Capillary: 81 mg/dL (ref 65–99)

## 2016-11-11 MED ORDER — POLYETHYLENE GLYCOL 3350 17 G PO PACK: 17.0000 g | PACK | Freq: Every day | ORAL | 0 refills | Status: DC

## 2016-11-11 MED ORDER — APIXABAN 5 MG PO TABS: ORAL_TABLET | ORAL | 0 refills | Status: DC

## 2016-11-11 MED ORDER — FERROUS SULFATE 325 (65 FE) MG PO TABS: 325.0000 mg | ORAL_TABLET | Freq: Every day | ORAL | 3 refills | Status: DC

## 2016-11-11 MED ORDER — AMLODIPINE BESYLATE 5 MG PO TABS: 5.0000 mg | ORAL_TABLET | Freq: Every day | ORAL | 0 refills | Status: DC

## 2016-11-11 MED ORDER — HYDROCODONE-ACETAMINOPHEN 5-325 MG PO TABS: 1.0000 | ORAL_TABLET | Freq: Four times a day (QID) | ORAL | 0 refills | Status: DC | PRN

## 2016-11-11 NOTE — Progress Notes (Signed)
Spoke with pt concerning Eliquis. Pt was given a 30 day free coupon by Pharm. Pt had questions on what to do after the 30 days are up. Encouraged pt to call the toll free number on the card to ask for other programs to help with co pay.  Pt have insurance.

## 2016-11-11 NOTE — Discharge Summary (Addendum)
**Note De-Identified Davenport Obfuscation** Physician Discharge Summary  Jenna RootsDawn Davenport ZOX:[id number] DOB: 08/01/1971 DOA: 11/08/2016  PCP: Jenna BoopVIA, KEVIN, MD  Admit date: 11/08/2016 Discharge date: 11/11/2016  Admitted From: Home  Disposition:  Home   Recommendations for Outpatient Follow-up:  1. Follow up with PCP in 1 weeks and get CBC 2. Please obtain BMP/CBC in one week  Discharge Condition: Stable  CODE STATUS: FULL  Diet recommendation: DASH Heart Healthy  Brief/Interim Summary: Hospital course: HPI: Jenna Pattersonis a 45 y.o.femalewith medical history significant for iron deficiency anemia, obesity status post gastric sleeve, and a rash for which she takes Plaquenil, now presenting to the emergency department for evaluation of pain under the right breast radiating around to the right flank for the past week. Patient reports that she had been in her usual state of health until developing some pain under the right breast approximately one week ago which she describes as severe, radiating around to the right flank, worse with deep inspiration or cough, and associated with exertional dyspnea. The patient denies any chest pain or palpitations and denies leg swelling or tenderness. She reports working from home and describes a very sedentary lifestyle, spending waking hours in front of a computer and going several days at a time without leaving the house. She denies any personal or family history of VTE, is not taking hormones, is a nonsmoker, and denies any recent long distance travel, surgery, or trauma. She was evaluated for these complaints a few days ago in urgent care,microscopic hematuria was noted on urinalysis,patient was diagnosed with probable kidney stones and prescribed hydrocodone. Symptoms continued to progress despite this and so she now presents to the ED.  Assessment & Plan:   1. Acute PE / DVT left popliteal - Presents with pleuritic pain, tachycardia, dyspnea  - CTA study is limited by bolus timing, but reveals  segmental PE in left lung and radiology expressed suspicion for right-sided PE as well  - Possible inciting factors include obesity and very sedentary lifestyle;  - BP has remained stable in ED  - Transition from lovenox to oral eliquis and tolerated so far.  No bleeding events.  I discussed and counseled with patient and this was her choice.  I asked the pharmacist to assist with dosing and education, counseling of patient which they did an excellent job of that and gave her a 30 day voucher for the medication.  Her fiance was also present for the education.  Pt had some SOB with ambulation but pulse ox did not drop below 90% with ambulation so patient declined home oxygen therapy.    2. Microscopic hematuria  - Identified at an urgent care last week and again present on admission  - CT renal stone study negative for stones or obstruction; simple renal cyst noted, but no evidence for malignancy  - She is a non-smoker   3. Rash  - Pt has been under treatment with Plaquenil for a rash, but is unsure of diagnosis  - Follow up with regular dermatologist  4. Iron-deficiency anemia - Hgb is 12.8 on admission  - Continue iron-supplementation but reduce to once daily  5.  Essential Hypertension - Pt had LVH seen on Echocardiogram with normal EF.  Labile BPs noted in hospital. Started on amlodipine 5 mg daily with outpatient follow up with PCP in 1 week.   DVT prophylaxis:eliquis Code Status:Full  Family Communication:Discussed with patient Disposition Plan:Home 11/14 Consults called:None Admission status:Inpatient   Discharge Diagnoses:  Principal Problem:   Acute pulmonary embolism (HCC) Active  Problems:   Obesity   Acute right flank pain   Tachycardia   Dyspnea on effort   Rash   Hematuria   Iron deficiency anemia  Discharge Instructions  Discharge Instructions    Diet - low sodium heart healthy    Complete by:  As directed    Increase activity slowly    Complete  by:  As directed        Medication List    TAKE these medications   amLODipine 5 MG tablet Commonly known as:  NORVASC Take 1 tablet (5 mg total) by mouth daily.   apixaban 5 MG Tabs tablet Commonly known as:  ELIQUIS Take 2 tabs po BID thru 11/16/16 then take 1 tab po BID starting 11/17/16.   ferrous sulfate 325 (65 FE) MG tablet Take 1 tablet (325 mg total) by mouth daily with breakfast. What changed:  when to take this   halobetasol 0.05 % ointment Commonly known as:  ULTRAVATE Apply 1 application topically 2 (two) times daily.   HYDROcodone-acetaminophen 5-325 MG tablet Commonly known as:  NORCO/VICODIN Take 1 tablet by mouth every 6 (six) hours as needed for moderate pain.   hydroxychloroquine 200 MG tablet Commonly known as:  PLAQUENIL Take 200 mg by mouth 2 (two) times daily.   MULTIVITAMIN GUMMIES ADULT PO Take 2 each by mouth daily.   polyethylene glycol packet Commonly known as:  MIRALAX / GLYCOLAX Take 17 g by mouth daily.   valACYclovir 1000 MG tablet Commonly known as:  VALTREX Take 1,000 mg by mouth daily.            Durable Medical Equipment        Start     Ordered   11/11/16 1041  For home use only DME oxygen  Once    Question Answer Comment  Mode or (Route) Nasal cannula   Liters per Minute 2   Frequency Continuous (stationary and portable oxygen unit needed)   Oxygen delivery system Gas      11/11/16 1041     Follow-up Information    Davenport, KEVIN, MD. Schedule an appointment as soon as possible for a visit in 1 week(s).   Specialty:  Family Medicine Why:  Hospital Follow Up, check CBC Contact information: [address] [place] [address] [phone]          Allergies  Allergen Reactions  . Pollen Extract Other (See Comments)    Itchy, watery eyes    Procedures/Studies:  Ct Angio Chest Pe W Or Wo Contrast  Result Date: 11/08/2016 CLINICAL DATA:  Bibasilar pneumonia. EXAM: CT ANGIOGRAPHY CHEST WITH CONTRAST  TECHNIQUE: Multidetector CT imaging of the chest was performed using the standard protocol during bolus administration of intravenous contrast. Multiplanar CT image reconstructions and MIPs were obtained to evaluate the vascular anatomy. CONTRAST:  Contrast 100 cc Isovue 370 COMPARISON:  CT abdomen pelvis from earlier today. FINDINGS: Cardiovascular: The heart is enlarged. No pericardial effusion. No thoracic aortic aneurysm. No evidence for thoracic aortic dissection. Assessment of pulmonary arteries is limited by bolus timing and breathing motion, but the patient does appear to have segmental pulmonary embolus to the lingula (see image 99 series 10 and image 117 series 8). Mediastinum/Nodes: No mediastinal lymphadenopathy. There is no hilar lymphadenopathy. The esophagus has normal imaging features. There is no axillary lymphadenopathy. Lungs/Pleura: Right lower lobe collapse/ consolidation is associated with small right pleural effusion. Left lower lobe collapse/ consolidation noted without appreciable effusion. Upper Abdomen: Small cyst noted upper  pole left kidney. Patient is status post gastric bypass. Musculoskeletal: Bone windows reveal no worrisome lytic or sclerotic osseous lesions. Review of the MIP images confirms the above findings. IMPRESSION: 1. Limited study with apparent small volume left upper lobe pulmonary embolus to segmental left lower lobe arteries. 2. Bibasilar collapse/consolidation, right greater than left with small right pleural effusion. Critical Value/emergent results were called by telephone at the time of interpretation on 11/08/2016 at 11:38 pm to Dr. Geoffery LyonsUGLAS DELO , who verbally acknowledged these results. Electronically Signed   By: Kennith CenterEric  Mansell M.D.   On: 11/08/2016 23:39   Ct Renal Stone Study  Result Date: 11/08/2016 CLINICAL DATA:  Right flank pain since last Saturday. EXAM: CT ABDOMEN AND PELVIS WITHOUT CONTRAST TECHNIQUE: Multidetector CT imaging of the abdomen and  pelvis was performed following the standard protocol without IV contrast. COMPARISON:  None. FINDINGS: Lower chest: Small right effusion with patchy bibasilar airspace opacities with air bronchograms involving both lower lobes, right middle lobe and lingula. Heart is normal in size. No pericardial effusion. Hepatobiliary: Cholecystectomy. No space-occupying mass or biliary dilatation of the unenhanced liver. Pancreas: Normal Spleen: No splenomegaly. Adrenals/Urinary Tract: No adrenal mass. Simple cyst in the upper pole of the left kidney measuring 2.7 cm in diameter. There is no nephrolithiasis. Small extrarenal pelves are noted. No obstructive uropathy. Bladder is physiologically distended. Stomach/Bowel: Status post bariatric surgery without bowel obstruction or acute inflammation. Moderate amount of fecal residue within large bowel. Vascular/Lymphatic: Aortoiliac atherosclerosis. No aneurysm. No adenopathy. Reproductive: Dominant follicle noted of the left ovary. This measures approximately 2.7 cm. Uterus is unremarkable. Other: Small phleboliths are seen in the left hemipelvis. Musculoskeletal: T6 through T11 mild disc space narrowing without acute osseous abnormality. Lower lumbar facet hypertrophy most pronounced from L4-5 through L5-S1 bilaterally. These contribute to mild to moderate canal stenosis at L4-5. No acute fracture nor bone destruction. IMPRESSION: Bibasilar pneumonic consolidations right greater than left with small right sided parapneumonic effusion. No obstructive uropathy.  No nephrolithiasis. 2.7 cm left upper pole simple renal cyst. Status post bariatric surgery. Status post cholecystectomy. Lower lumbar facet arthropathy L4 through S1 contributing to mild-to-moderate central canal stenosis at L4-5. Electronically Signed   By: Tollie Ethavid  Kwon M.D.   On: 11/08/2016 19:58     ECHOCARDIOGRAM LV EF: 60% -   65%  ------------------------------------------------------------------- Indications:       Pulmonary embolus 415.19.  ------------------------------------------------------------------- History:   PMH:   Tachycardia. Anemia.  ------------------------------------------------------------------- Study Conclusions  - Left ventricle: The cavity size was normal. Wall thickness was   increased in a pattern of severe LVH. Systolic function was   normal. The estimated ejection fraction was in the range of 60%  to 65%. Left ventricular diastolic function parameters were  normal. - Aortic valve: Valve area (VTI): 3.22 cm^2. Valve area (Vmax):   3.09 cm^2. Valve area (Vmean): 3.23 cm^2. - Left atrium: The atrium was mildly dilated.  ------------------------------------------------------------------- Study data:  No prior study was available for comparison.  Study status:  Routine.  Procedure:  Transthoracic echocardiography. Image quality was adequate.  Study completion:  There were no complications.          Transthoracic echocardiography.  M-mode, complete 2D, spectral Doppler, and color Doppler.  Birthdate: Patient birthdate: 08/03/1971.  Age:  Patient is 45 yr old.  Sex: Gender: female.    BMI: 37.1 kg/m^2.  Blood pressure:     100/42 Patient status:  Inpatient.  Study date:  Study date: 11/09/2016. Study time: 09:54  AM.  Location:  Bedside.  ------------------------------------------------------------------- Left ventricle:  The cavity size was normal. Wall thickness was increased in a pattern of severe LVH. Systolic function was normal. The estimated ejection fraction was in the range of 60% to 65%. The transmitral flow pattern was normal. The deceleration time of the early transmitral flow velocity was normal. The pulmonary vein flow pattern was normal. The tissue Doppler parameters were normal. Left ventricular diastolic function parameters were normal.  ------------------------------------------------------------------- Aortic valve:   Structurally normal valve.   Cusp  separation was normal.  Doppler:  Transvalvular velocity was within the normal range. There was no stenosis. There was no regurgitation.    VTI ratio of LVOT to aortic valve: 0.85. Valve area (VTI): 3.22 cm^2. Indexed valve area (VTI): 1.4 cm^2/m^2. Peak velocity ratio of LVOT to aortic valve: 0.81. Valve area (Vmax): 3.09 cm^2. Indexed valve area (Vmax): 1.34 cm^2/m^2. Mean velocity ratio of LVOT to aortic valve: 0.85. Valve area (Vmean): 3.23 cm^2. Indexed valve area (Vmean): 1.4 cm^2/m^2.    Mean gradient (S): 5 mm Hg. Peak gradient (S): 9 mm Hg.   Subjective: Pt says much less pain with breathing and ambulating but still with SOB when ambulating.  No black stools.    Discharge Exam: Vitals:   11/10/16 2039 11/11/16 0513  BP: 137/74 (!) 152/86  Pulse: 89 89  Resp: 20 18  Temp:  98 F (36.7 C)   Vitals:   11/10/16 0548 11/10/16 1500 11/10/16 2039 11/11/16 0513  BP: 125/77 (!) 162/97 137/74 (!) 152/86  Pulse: 70 89 89 89  Resp: 20 20 20 18   Temp: 97.7 F (36.5 C) 97.7 F (36.5 C)  98 F (36.7 C)  TempSrc: Oral Oral  Oral  SpO2: 97% 98% 95% 97%  Weight:      Height:       General: Pt is alert, awake, not in acute distress Cardiovascular: RRR, S1/S2 +, no rubs, no gallops Respiratory: CTA bilaterally, no wheezing, no rhonchi Abdominal: Soft, NT, ND, bowel sounds + Extremities: no edema, no cyanosis  The results of significant diagnostics from this hospitalization (including imaging, microbiology, ancillary and laboratory) are listed below for reference.     Microbiology: No results found for this or any previous visit (from the past 240 hour(s)).   Labs: BNP (last 3 results) No results for input(s): BNP in the last 8760 hours. Basic Metabolic Panel:  Recent Labs Lab 11/08/16 1740 11/09/16 0037 11/10/16 0913  NA 134* 137 138  K 4.2 4.2 3.9  CL 101 105 104  CO2 27 27 29   GLUCOSE 116* 105* 92  BUN 8 7 6   CREATININE 0.79 0.73 0.75  CALCIUM 8.7* 8.3* 8.4*    Liver Function Tests:  Recent Labs Lab 11/08/16 1740 11/10/16 0913  AST 22 183*  ALT 11* 59*  ALKPHOS 62 99  BILITOT 0.6 0.4  PROT 8.3* 7.5  ALBUMIN 3.7 3.3*    Recent Labs Lab 11/08/16 1740  LIPASE 29   No results for input(s): AMMONIA in the last 168 hours. CBC:  Recent Labs Lab 11/08/16 1740 11/10/16 0913 11/11/16 0508  WBC 7.5 4.3 3.8*  NEUTROABS 5.6  --   --   HGB 12.8 12.4 11.0*  HCT 39.6 37.8 34.2*  MCV 88.2 86.5 86.6  PLT 257 257 245   Cardiac Enzymes: No results for input(s): CKTOTAL, CKMB, CKMBINDEX, TROPONINI in the last 168 hours. BNP: Invalid input(s): POCBNP CBG:  Recent Labs Lab 11/10/16 0755 11/11/16 0745  GLUCAP  82 81   D-Dimer No results for input(s): DDIMER in the last 72 hours. Hgb A1c No results for input(s): HGBA1C in the last 72 hours. Lipid Profile No results for input(s): CHOL, HDL, LDLCALC, TRIG, CHOLHDL, LDLDIRECT in the last 72 hours. Thyroid function studies No results for input(s): TSH, T4TOTAL, T3FREE, THYROIDAB in the last 72 hours.  Invalid input(s): FREET3 Anemia work up No results for input(s): VITAMINB12, FOLATE, FERRITIN, TIBC, IRON, RETICCTPCT in the last 72 hours. Urinalysis    Component Value Date/Time   COLORURINE YELLOW 11/08/2016 1711   APPEARANCEUR CLOUDY (A) 11/08/2016 1711   LABSPEC 1.013 11/08/2016 1711   PHURINE 6.0 11/08/2016 1711   GLUCOSEU NEGATIVE 11/08/2016 1711   HGBUR SMALL (A) 11/08/2016 1711   BILIRUBINUR NEGATIVE 11/08/2016 1711   KETONESUR NEGATIVE 11/08/2016 1711   PROTEINUR NEGATIVE 11/08/2016 1711   UROBILINOGEN 0.2 07/11/2014 0800   NITRITE NEGATIVE 11/08/2016 1711   LEUKOCYTESUR NEGATIVE 11/08/2016 1711   Sepsis Labs Invalid input(s): PROCALCITONIN,  WBC,  LACTICIDVEN Microbiology No results found for this or any previous visit (from the past 240 hour(s)).  Time coordinating discharge:32 minutes  SIGNED: Standley Dakins, MD  [facility] 11/11/2016, 10:54  AM Pager   If 7PM-7AM, please contact night-coverage [email or web address] Password [email or web address]

## 2016-11-22 ENCOUNTER — Emergency Department (HOSPITAL_COMMUNITY): Payer: BLUE CROSS/BLUE SHIELD

## 2016-11-22 ENCOUNTER — Other Ambulatory Visit: Payer: Self-pay

## 2016-11-22 ENCOUNTER — Emergency Department (HOSPITAL_COMMUNITY)
Admission: EM | Admit: 2016-11-22 | Discharge: 2016-11-22 | Disposition: A | Discharge status: Home / Self Care | Payer: BLUE CROSS/BLUE SHIELD | Attending: Emergency Medicine | Admitting: Emergency Medicine

## 2016-11-22 ENCOUNTER — Encounter (HOSPITAL_COMMUNITY): Payer: Self-pay | Admitting: Emergency Medicine

## 2016-11-22 DIAGNOSIS — Z79899 Other long term (current) drug therapy: Secondary | ICD-10-CM | POA: Diagnosis not present

## 2016-11-22 DIAGNOSIS — I10 Essential (primary) hypertension: Secondary | ICD-10-CM | POA: Insufficient documentation

## 2016-11-22 DIAGNOSIS — F419 Anxiety disorder, unspecified: Secondary | ICD-10-CM | POA: Insufficient documentation

## 2016-11-22 DIAGNOSIS — Z7901 Long term (current) use of anticoagulants: Secondary | ICD-10-CM | POA: Insufficient documentation

## 2016-11-22 DIAGNOSIS — R0602 Shortness of breath: Secondary | ICD-10-CM | POA: Diagnosis present

## 2016-11-22 HISTORY — DX: Other pulmonary embolism without acute cor pulmonale: I26.99

## 2016-11-22 MED ORDER — LORAZEPAM 1 MG PO TABS: 1.0000 mg | ORAL_TABLET | Freq: Three times a day (TID) | ORAL | 0 refills | Status: DC | PRN

## 2016-11-22 MED ORDER — IPRATROPIUM-ALBUTEROL 0.5-2.5 (3) MG/3ML IN SOLN
3.0000 mL | Freq: Once | RESPIRATORY_TRACT | Status: AC
  Administered 2016-11-22: 3 mL via RESPIRATORY_TRACT
  Filled 2016-11-22: qty 3

## 2016-11-22 NOTE — ED Triage Notes (Signed)
Pt reports having prior hx of blood clots in legs and lungs and was discharged on 11/11/16 for blood clots. Pt now having increase of shortness of breath around 0200 this morning that woke pt from sleep. Pt reports being on [place]. No acute distress noted at time of triage.

## 2016-11-22 NOTE — ED Provider Notes (Signed)
[facility]-EMERGENCY DEPT Provider Note   CSN: [id number] Arrival date & time: 11/22/16  0354     History   Chief Complaint Chief Complaint  Patient presents with  . Shortness of Breath    HPI Jenna Davenport is a 45 y.o. female.  She was discharged the hospital and November 14. She had been hospitalized with pulmonary embolism and DVT in the left lower leg. She woke up this morning acutely short of breath. Dyspnea has waxed and waned. Nothing seems to make it better nothing seems to make it worse. She denies any chest pain, heaviness, tightness, pressure. There is no nausea or diaphoresis. She is also noted a tingling type of pain in her left leg which seems to be moving up into the thigh and she is worried that they caught may be growing. She is currently anticoagulated on apixaban and states she has been compliant with the medication.    Shortness of Breath     Past Medical History:  Diagnosis Date  . Anemia   . Pulmonary embolism [facility](HCC)     Patient Active Problem List   Diagnosis Date Noted  . Acute deep vein thrombosis (DVT) of popliteal vein of left lower extremity (HCC) 11/11/2016  . Essential hypertension 11/11/2016  . Tachycardia 11/09/2016  . Dyspnea on effort 11/09/2016  . Rash 11/09/2016  . Hematuria 11/09/2016  . Iron deficiency anemia 11/09/2016  . Acute pulmonary embolism (HCC) 11/08/2016  . Obesity 11/08/2016  . Acute right flank pain 11/08/2016    Past Surgical History:  Procedure Laterality Date  . CHOLECYSTECTOMY     2007  . GASTRIC BYPASS     2011  . TUBAL LIGATION     2008    OB History    No data available       Home Medications    Prior to Admission medications   Medication Sig Start Date End Date Taking? Authorizing Provider  amLODipine (NORVASC) 5 MG tablet Take 1 tablet (5 mg total) by mouth daily. 11/11/16   Clanford Cyndie MullL Johnson, MD  apixaban (ELIQUIS) 5 MG TABS tablet Take 2 tabs po BID thru 11/16/16 then take 1 tab po BID starting  11/17/16. 11/11/16 11/11/16  Clanford Cyndie MullL Johnson, MD  ferrous sulfate 325 (65 FE) MG tablet Take 1 tablet (325 mg total) by mouth daily with breakfast. 11/11/16   Clanford Cyndie MullL Johnson, MD  halobetasol (ULTRAVATE) 0.05 % ointment Apply 1 application topically 2 (two) times daily.    Historical Provider, MD  HYDROcodone-acetaminophen (NORCO/VICODIN) 5-325 MG tablet Take 1 tablet by mouth every 6 (six) hours as needed for moderate pain. 11/11/16   Clanford Cyndie MullL Johnson, MD  hydroxychloroquine (PLAQUENIL) 200 MG tablet Take 200 mg by mouth 2 (two) times daily.    Historical Provider, MD  Multiple Vitamins-Minerals (MULTIVITAMIN GUMMIES ADULT PO) Take 2 each by mouth daily.    Historical Provider, MD  polyethylene glycol (MIRALAX / GLYCOLAX) packet Take 17 g by mouth daily. 11/11/16   Clanford Cyndie MullL Johnson, MD  valACYclovir (VALTREX) 1000 MG tablet Take 1,000 mg by mouth daily.    Historical Provider, MD    Family History Family History  Problem Relation Age of Onset  . Seizures Mother   . Stroke Mother   . Heart failure Mother   . Hyperlipidemia Mother   . Hypertension Father   . Diabetes Father   . Seizures Father   . Hyperlipidemia Father     Social History Social History  Substance Use Topics  .  Smoking status: Never Smoker  . Smokeless tobacco: Never Used  . Alcohol use Yes     Comment: occasionally     Allergies   Pollen extract   Review of Systems Review of Systems  Respiratory: Positive for shortness of breath.   All other systems reviewed and are negative.    Physical Exam Updated Vital Signs BP 148/78 (BP Location: Left Arm)   Pulse (!) 59   Temp 98.1 F (36.7 C) (Oral)   Resp 19   Ht 5\' 7"  (1.702 m)   Wt 231 lb (104.8 kg)   LMP 11/22/2016   SpO2 100%   BMI 36.18 kg/m   Physical Exam  Nursing note and vitals reviewed.  45 year old female, resting comfortably and in no acute distress. Vital signs are Significant for hypertension and borderline bradycardia.  Oxygen saturation is 100%, which is normal. Head is normocephalic and atraumatic. PERRLA, EOMI. Oropharynx is clear. Neck is nontender and supple without adenopathy or JVD. Back is nontender and there is no CVA tenderness. Lungs are clear without rales, wheezes, or rhonchi. Chest is significant for a tender subcutaneous nodule in the left lateral chest wall. Nodule is freely movable. No other chest wall tenderness. Heart has regular rate and rhythm without murmur. Abdomen is soft, flat, nontender without masses or hepatosplenomegaly and peristalsis is normoactive. Extremities have no cyanosis or edema, full range of motion is present. Skin is warm and dry without rash. Neurologic: Mental status is normal, cranial nerves are intact, there are no motor or sensory deficits.  ED Treatments / Results   EKG ECG shows normal sinus rhythm with a rate of 67, no ectopy. Normal axis. Normal P wave. Normal QRS. Normal intervals. Normal ST and T waves. Impression: normal ECG.  Radiology Dg Chest 2 View  Result Date: 11/22/2016 CLINICAL DATA:  Chest tightness with tingling and numbness in the left arm and left leg. Shortness of breath since 2 a.m. today. Patient was here 2 weeks ago for pulmonary embolus. EXAM: CHEST  2 VIEW COMPARISON:  07/11/2014 FINDINGS: Mild linear atelectasis in the right lung base. Normal heart size and pulmonary vascularity. No focal airspace disease or consolidation in the lungs. No blunting of costophrenic angles. No pneumothorax. Mediastinal contours appear intact. Degenerative changes in the spine. Surgical clips in the right upper quadrant. IMPRESSION: Mild linear atelectasis in the right lung base. No evidence of active pulmonary disease. Electronically Signed   By: Burman Nieves M.D.   On: 11/22/2016 04:38    Procedures Procedures (including critical care time)  Medications Ordered in ED Medications  ipratropium-albuterol (DUONEB) 0.5-2.5 (3) MG/3ML nebulizer  solution 3 mL (3 mLs Nebulization Given 11/22/16 0509)     Initial Impression / Assessment and Plan / ED Course  I have reviewed the triage vital signs and the nursing notes.  Pertinent imaging results that were available during my care of the patient were reviewed by me and considered in my medical decision making (see chart for details).  Clinical Course    Subjective dyspnea. However, patient has normal heart rate and oxygen saturation 100%. No indication for repeat CT angiogram since previously seen clots should still be present. Old records were reviewed confirming hospitalization from November 11-14 for pulmonary embolism. I will give her a therapeutic trial of albuterol with ipratropium to see if it helps.  She had no relief with albuterol with ipratropium. Symptoms seem to be related to anxiety relating to recent diagnosis of pulmonary embolism. I spent some  time discussing the natural history of pulmonary embolism and how it would respond anticoagulation. She has had noted dyspnea while in the ED. She is discharged with prescription for lorazepam and referred to PCP for follow-up.  Final Clinical Impressions(s) / ED Diagnoses   Final diagnoses:  SOB (shortness of breath)  Anxiety    New Prescriptions New Prescriptions   No medications on file     Dione Boozeavid Pansy Ostrovsky, MD 11/22/16 0600

## 2017-06-24 DIAGNOSIS — R87810 Cervical high risk human papillomavirus (HPV) DNA test positive: Secondary | ICD-10-CM | POA: Insufficient documentation

## 2017-09-10 DIAGNOSIS — F4322 Adjustment disorder with anxiety: Secondary | ICD-10-CM | POA: Diagnosis not present

## 2017-09-15 DIAGNOSIS — N840 Polyp of corpus uteri: Secondary | ICD-10-CM | POA: Diagnosis not present

## 2017-09-17 DIAGNOSIS — F4322 Adjustment disorder with anxiety: Secondary | ICD-10-CM | POA: Diagnosis not present

## 2017-09-23 DIAGNOSIS — N920 Excessive and frequent menstruation with regular cycle: Secondary | ICD-10-CM | POA: Diagnosis not present

## 2017-09-23 DIAGNOSIS — I10 Essential (primary) hypertension: Secondary | ICD-10-CM | POA: Diagnosis not present

## 2017-09-23 DIAGNOSIS — N921 Excessive and frequent menstruation with irregular cycle: Secondary | ICD-10-CM | POA: Diagnosis not present

## 2017-09-23 DIAGNOSIS — Z79899 Other long term (current) drug therapy: Secondary | ICD-10-CM | POA: Diagnosis not present

## 2017-09-23 DIAGNOSIS — Z86718 Personal history of other venous thrombosis and embolism: Secondary | ICD-10-CM | POA: Diagnosis not present

## 2017-09-23 DIAGNOSIS — N840 Polyp of corpus uteri: Secondary | ICD-10-CM | POA: Diagnosis not present

## 2017-09-23 DIAGNOSIS — D649 Anemia, unspecified: Secondary | ICD-10-CM | POA: Diagnosis not present

## 2017-09-23 DIAGNOSIS — Z7901 Long term (current) use of anticoagulants: Secondary | ICD-10-CM | POA: Diagnosis not present

## 2017-09-23 DIAGNOSIS — Z9851 Tubal ligation status: Secondary | ICD-10-CM | POA: Diagnosis not present

## 2017-09-24 DIAGNOSIS — F4322 Adjustment disorder with anxiety: Secondary | ICD-10-CM | POA: Diagnosis not present

## 2017-09-30 DIAGNOSIS — Z86711 Personal history of pulmonary embolism: Secondary | ICD-10-CM | POA: Diagnosis not present

## 2017-09-30 DIAGNOSIS — I83892 Varicose veins of left lower extremities with other complications: Secondary | ICD-10-CM | POA: Diagnosis not present

## 2017-10-01 DIAGNOSIS — F4322 Adjustment disorder with anxiety: Secondary | ICD-10-CM | POA: Diagnosis not present

## 2017-10-09 DIAGNOSIS — F4322 Adjustment disorder with anxiety: Secondary | ICD-10-CM | POA: Diagnosis not present

## 2017-10-13 ENCOUNTER — Encounter: Payer: Self-pay | Admitting: Podiatry

## 2017-10-13 ENCOUNTER — Ambulatory Visit (INDEPENDENT_AMBULATORY_CARE_PROVIDER_SITE_OTHER): Payer: BLUE CROSS/BLUE SHIELD

## 2017-10-13 ENCOUNTER — Ambulatory Visit (INDEPENDENT_AMBULATORY_CARE_PROVIDER_SITE_OTHER): Payer: BLUE CROSS/BLUE SHIELD | Admitting: Podiatry

## 2017-10-13 VITALS — BP 125/83 | HR 97 | Ht 67.0 in | Wt 238.0 lb

## 2017-10-13 DIAGNOSIS — M2140 Flat foot [pes planus] (acquired), unspecified foot: Secondary | ICD-10-CM

## 2017-10-13 DIAGNOSIS — M25572 Pain in left ankle and joints of left foot: Secondary | ICD-10-CM

## 2017-10-13 DIAGNOSIS — S86112A Strain of other muscle(s) and tendon(s) of posterior muscle group at lower leg level, left leg, initial encounter: Secondary | ICD-10-CM

## 2017-10-13 NOTE — Progress Notes (Signed)
She presents today as a new patient she states that she is having pain to the medial aspect of the left ankle. States that July 2016 she injured the foot and gravel twisting the ankle. States that she saw a podiatrist who has been treating her with immobilization, Tri-Lock braces injection therapy and anti-inflammatories. She states that nothing seems to be working. She had a history of DVT and pulmonary embolism is currently taking a look was. She states that she has tried immobilizing it with the boot to know about.  She also states that she has a history of wearing a Brown's brace when she was young. She states that due to this injury she had to stop wearing heels 3 years ago her weight has been up and down and has even lost weight from 337 2011 that down as low as 190 pounds and now is back up to 38.  Objective: Vital signs are stable she is alert and oriented 3. Weight is 238. Height is 67 inches blood pressure is 125/83 pulse is 97. No apparent distress. Pulses are strongly palpable bilateral. Neurologic sensorium is intact per Semmes-Weinstein monofilament bilaterally. Deep tendon reflexes are intact bilateral and muscle strength +5 over 5 dorsiflexion plantar flexors and inverters everters all intrinsic musculature is intact with exception of the posterior tibial tendon of the left foot and ankle which is very weak at +2 or 3 out of 5.  Radiographic evaluation demonstrates severe pes planus calcaneal valgus midfoot collapse. Soft tissue thickening over the area of the posterior tibial tendon is noted on AP.  Assessment: Posterior tibial tendinitis with pes planus. Rule out posterior tibial tendon tear  Plan: Discussed etiology pathology conservative versus surgical therapies this disability is limiting her ability to perform her daily activities and control her general health and a good manner. After having failed 2 years of treatment I feel is necessary to evaluate the rear foot and the posterior  tibial tendon. I will follow-up with her after MRI of the left foot.

## 2017-10-14 ENCOUNTER — Telehealth: Payer: Self-pay | Admitting: *Deleted

## 2017-10-14 DIAGNOSIS — M25572 Pain in left ankle and joints of left foot: Secondary | ICD-10-CM

## 2017-10-14 DIAGNOSIS — S86112A Strain of other muscle(s) and tendon(s) of posterior muscle group at lower leg level, left leg, initial encounter: Secondary | ICD-10-CM

## 2017-10-14 DIAGNOSIS — M2141 Flat foot [pes planus] (acquired), right foot: Secondary | ICD-10-CM

## 2017-10-14 DIAGNOSIS — M2142 Flat foot [pes planus] (acquired), left foot: Secondary | ICD-10-CM

## 2017-10-14 NOTE — Telephone Encounter (Addendum)
Tomasa HoseJ. Quintana, RN states Dr. Logan BoresEvans ordered MRI left ankle for traumatic posterior tibial tendon tear, and flat foot deformity. [facility] to [facility] for pre[facility]cert [facility].

## 2017-10-15 DIAGNOSIS — F4322 Adjustment disorder with anxiety: Secondary | ICD-10-CM | POA: Diagnosis not present

## 2017-10-20 ENCOUNTER — Telehealth: Payer: Self-pay | Admitting: *Deleted

## 2017-10-20 NOTE — Telephone Encounter (Signed)
"  She needs authorization from [employer].  Her appointment is on Sunday, October 28 at 3:30 pm.  It's a MRI of left ankle without contrast.  Call if you have any questions."

## 2017-10-22 DIAGNOSIS — F4322 Adjustment disorder with anxiety: Secondary | ICD-10-CM | POA: Diagnosis not present

## 2017-10-23 NOTE — Telephone Encounter (Signed)
I obtained authorization for the MRI.  [facility] is aware.

## 2017-10-25 ENCOUNTER — Emergency Department (HOSPITAL_COMMUNITY)
Admission: EM | Admit: 2017-10-25 | Discharge: 2017-10-25 | Disposition: A | Discharge status: Home / Self Care | Payer: BLUE CROSS/BLUE SHIELD | Attending: Emergency Medicine | Admitting: Emergency Medicine

## 2017-10-25 ENCOUNTER — Encounter (HOSPITAL_COMMUNITY): Payer: Self-pay

## 2017-10-25 ENCOUNTER — Ambulatory Visit
Admission: RE | Admit: 2017-10-25 | Discharge: 2017-10-25 | Disposition: A | Discharge status: Home / Self Care | Payer: BLUE CROSS/BLUE SHIELD | Source: Ambulatory Visit | Attending: Podiatry | Admitting: Podiatry

## 2017-10-25 DIAGNOSIS — N390 Urinary tract infection, site not specified: Secondary | ICD-10-CM | POA: Diagnosis not present

## 2017-10-25 DIAGNOSIS — B9689 Other specified bacterial agents as the cause of diseases classified elsewhere: Secondary | ICD-10-CM

## 2017-10-25 DIAGNOSIS — N760 Acute vaginitis: Secondary | ICD-10-CM | POA: Insufficient documentation

## 2017-10-25 DIAGNOSIS — R1031 Right lower quadrant pain: Secondary | ICD-10-CM | POA: Diagnosis not present

## 2017-10-25 DIAGNOSIS — S86012A Strain of left Achilles tendon, initial encounter: Secondary | ICD-10-CM | POA: Diagnosis not present

## 2017-10-25 LAB — URINALYSIS, ROUTINE W REFLEX MICROSCOPIC
Bilirubin Urine: NEGATIVE
Glucose, UA: NEGATIVE mg/dL
Ketones, ur: NEGATIVE mg/dL
Nitrite: NEGATIVE
Protein, ur: 30 mg/dL — AB
Specific Gravity, Urine: 1.016 (ref 1.005–1.030)
pH: 5 (ref 5.0–8.0)

## 2017-10-25 LAB — WET PREP, GENITAL
Trich, Wet Prep: NONE SEEN
Yeast Wet Prep HPF POC: NONE SEEN

## 2017-10-25 LAB — PREGNANCY, URINE: Preg Test, Ur: NEGATIVE

## 2017-10-25 MED ORDER — CEPHALEXIN 500 MG PO CAPS: 500.0000 mg | ORAL_CAPSULE | Freq: Two times a day (BID) | ORAL | 0 refills | Status: DC

## 2017-10-25 MED ORDER — METRONIDAZOLE 500 MG PO TABS
500.0000 mg | ORAL_TABLET | Freq: Once | ORAL | Status: AC
  Administered 2017-10-25: 500 mg via ORAL
  Filled 2017-10-25: qty 1

## 2017-10-25 MED ORDER — CEPHALEXIN 500 MG PO CAPS
1000.0000 mg | ORAL_CAPSULE | Freq: Once | ORAL | Status: AC
  Administered 2017-10-25: 1000 mg via ORAL
  Filled 2017-10-25: qty 2

## 2017-10-25 MED ORDER — METRONIDAZOLE 500 MG PO TABS: 500.0000 mg | ORAL_TABLET | Freq: Two times a day (BID) | ORAL | 0 refills | Status: DC

## 2017-10-25 NOTE — ED Provider Notes (Signed)
[facility]-EMERGENCY DEPT Provider Note: Lowella DellJ. Lane Mirza Fessel, MD, FACEP  CSN: [id number] MRN: [id number] ARRIVAL: 10/25/17 at 0011 ROOM: WA24/WA24   CHIEF COMPLAINT  Abdominal Pain   HISTORY OF PRESENT ILLNESS  10/25/17 2:00 AM Jenna Jarold Mottoatterson is a 46 y.o. female with a history of pulmonary embolism currently on Eliquis.  She has had right suprapubic pain for the past 4 days which radiates around to her right side.  She rates her pain as a 3 out of 10 presently.  This was originally accompanied with fever, malaise and shortness of breath but these have resolved.  She continues to have a pinkish vaginal discharge.  She is status post uterine ablation in September of this year.  She is not having chest pain.  She is not currently having shortness of breath.  She denies pain or swelling in her legs.   Past Medical History:  Diagnosis Date  . Anemia   . Pulmonary embolism [facility](HCC)     Past Surgical History:  Procedure Laterality Date  . CHOLECYSTECTOMY     2007  . GASTRIC BYPASS     2011  . TUBAL LIGATION     2008    Family History  Problem Relation Age of Onset  . Seizures Mother   . Stroke Mother   . Heart failure Mother   . Hyperlipidemia Mother   . Hypertension Father   . Diabetes Father   . Seizures Father   . Hyperlipidemia Father     Social History  Substance Use Topics  . Smoking status: Never Smoker  . Smokeless tobacco: Never Used  . Alcohol use Yes     Comment: occasionally    Prior to Admission medications   Medication Sig Start Date End Date Taking? Authorizing Provider  amLODipine (NORVASC) 5 MG tablet Take 1 tablet (5 mg total) by mouth daily. 11/11/16  Yes Johnson, Clanford L, MD  apixaban (ELIQUIS) 5 MG TABS tablet Take 2 tabs po BID thru 11/16/16 then take 1 tab po BID starting 11/17/16. Patient taking differently: Take 5 mg by mouth 2 (two) times daily.  11/11/16 10/25/17 Yes Johnson, Clanford L, MD  ferrous sulfate 325 (65 FE) MG tablet Take 1 tablet (325  mg total) by mouth daily with breakfast. 11/11/16  Yes Johnson, Clanford L, MD  Multiple Vitamins-Minerals (HAIR SKIN AND NAILS FORMULA) TABS Take 2 tablets by mouth daily.   Yes [provider]  Multiple Vitamins-Minerals (MULTIVITAMIN GUMMIES ADULT PO) Take 2 tablets by mouth daily.    Yes [provider]  valACYclovir (VALTREX) 1000 MG tablet Take 1,000 mg by mouth daily.   Yes [provider]  LORazepam (ATIVAN) 1 MG tablet Take 1 tablet (1 mg total) by mouth 3 (three) times daily as needed for anxiety. Patient not taking: Reported on 10/25/2017 11/22/16   Dione BoozeGlick, David, MD    Allergies Pollen extract   REVIEW OF SYSTEMS  Negative except as noted here or in the History of Present Illness.   PHYSICAL EXAMINATION  Initial Vital Signs Blood pressure 134/85, pulse 100, temperature 98.5 F (36.9 C), temperature source Oral, resp. rate 18, height 5\' 7"  (1.702 m), weight 107 kg (236 lb), SpO2 100 %.  Examination General: Well-developed, well-nourished female in no acute distress; appearance consistent with age of record HENT: normocephalic; atraumatic Eyes: pupils equal, round and reactive to light; extraocular muscles intact Neck: supple Heart: regular rate and rhythm Lungs: clear to auscultation bilaterally Abdomen: soft; nondistended; right suprapubic tenderness; no masses or hepatosplenomegaly;  bowel sounds present GU: No CVA tenderness; normal external genitalia; yellow cervical discharge; no vaginal bleeding; no cervical motion tenderness; no adnexal tenderness Extremities: No deformity; full range of motion; pulses normal Neurologic: Awake, alert and oriented; motor function intact in all extremities and symmetric; no facial droop Skin: Warm and dry Psychiatric: Normal mood and affect   RESULTS  Summary of this visit's results, reviewed by myself:   EKG Interpretation  Date/Time:  Sunday October 25 2017 00:24:39 EDT Ventricular Rate:  90 PR  Interval:    QRS Duration: 80 QT Interval:  347 QTC Calculation: 425 R Axis:   -2 Text Interpretation:  Sinus rhythm Artifact Otherwise within normal limits Confirmed by Paula Libra ([id number]) on 10/25/2017 12:44:37 AM      Laboratory Studies: Results for orders placed or performed during the hospital encounter of 10/25/17 (from the past 24 hour(s))  Urinalysis, Routine w reflex microscopic     Status: Abnormal   Collection Time: 10/25/17  2:18 AM  Result Value Ref Range   Color, Urine YELLOW YELLOW   APPearance HAZY (A) CLEAR   Specific Gravity, Urine 1.016 1.005 - 1.030   pH 5.0 5.0 - 8.0   Glucose, UA NEGATIVE NEGATIVE mg/dL   Hgb urine dipstick MODERATE (A) NEGATIVE   Bilirubin Urine NEGATIVE NEGATIVE   Ketones, ur NEGATIVE NEGATIVE mg/dL   Protein, ur 30 (A) NEGATIVE mg/dL   Nitrite NEGATIVE NEGATIVE   Leukocytes, UA MODERATE (A) NEGATIVE   RBC / HPF 6-30 0 - 5 RBC/hpf   WBC, UA TOO NUMEROUS TO COUNT 0 - 5 WBC/hpf   Bacteria, UA MANY (A) NONE SEEN   Squamous Epithelial / LPF 0-5 (A) NONE SEEN   Mucus PRESENT    Sperm, UA PRESENT   Pregnancy, urine     Status: None   Collection Time: 10/25/17  2:18 AM  Result Value Ref Range   Preg Test, Ur NEGATIVE NEGATIVE  Wet prep, genital     Status: Abnormal   Collection Time: 10/25/17  2:19 AM  Result Value Ref Range   Yeast Wet Prep HPF POC NONE SEEN NONE SEEN   Trich, Wet Prep NONE SEEN NONE SEEN   Clue Cells Wet Prep HPF POC PRESENT (A) NONE SEEN   WBC, Wet Prep HPF POC MODERATE (A) NONE SEEN   Sperm PRESENT    Imaging Studies: No results found.  ED COURSE  Nursing notes and initial vitals signs, including pulse oximetry, reviewed.  Vitals:   10/25/17 0017 10/25/17 0032  BP: 134/85   Pulse: 100   Resp: 18   Temp: 98.5 F (36.9 C)   TempSrc: Oral   SpO2: 100%   Weight:  107 kg (236 lb)  Height:  5\' 7"  (1.702 m)    PROCEDURES    ED DIAGNOSES     ICD-10-CM   1. Lower urinary tract infectious disease N39.0    2. BV (bacterial vaginosis) N76.0    B96.89        Edsel Shives, Jonny Ruiz, MD 10/25/17 [phone]

## 2017-10-25 NOTE — ED Triage Notes (Addendum)
Pt presents c/o shortness of breath progressively worsening over the last two days and pain that is intermittently coming in her lower back, chest, and legs.  Pt has a hx of PE and states "is having similar pain to when she had PE's"  Pt also stated she had [place] sure surgery ablation of the uterus lining 09/23/17 and "wants to make sure she doesn't have an infection because "something isn't right" Pt states she has light pink discharge.

## 2017-10-26 LAB — GC/CHLAMYDIA PROBE AMP (~~LOC~~) NOT AT ARMC
Chlamydia: NEGATIVE
Neisseria Gonorrhea: NEGATIVE

## 2017-10-27 LAB — URINE CULTURE: Culture: >=100000 — AB

## 2017-10-28 ENCOUNTER — Telehealth: Payer: Self-pay | Admitting: *Deleted

## 2017-10-28 NOTE — Telephone Encounter (Signed)
Post ED Visit - Positive Culture Follow-up  Culture report reviewed by antimicrobial stewardship pharmacist:  []  Jenna Davenport, Pharm.D. []  Jenna Davenport, Pharm.D., BCPS AQ-ID []  Jenna Davenport, Pharm.D., BCPS []  Jenna Davenport, Pharm.D., BCPS []  Jenna Davenport, [address]Pharm[address]D., BCPS, AAHIVP []  Jenna Davenport, Pharm.D., BCPS, AAHIVP []  Jenna Davenport, PharmD, BCPS []  Jenna Davenport, PharmD, BCPS []  Jenna Davenport, PharmD, BCPS Jenna Davenport, PharmD  Positive urine culture Treated with Cephalexin, organism sensitive to the same and no further patient follow-up is required at this time.  Jenna Davenport, Jenna Davenport 10/28/2017, 12:04 PM

## 2017-11-02 DIAGNOSIS — F4322 Adjustment disorder with anxiety: Secondary | ICD-10-CM | POA: Diagnosis not present

## 2017-11-03 DIAGNOSIS — N390 Urinary tract infection, site not specified: Secondary | ICD-10-CM | POA: Diagnosis not present

## 2017-11-03 DIAGNOSIS — Z9889 Other specified postprocedural states: Secondary | ICD-10-CM | POA: Diagnosis not present

## 2017-11-04 DIAGNOSIS — N390 Urinary tract infection, site not specified: Secondary | ICD-10-CM | POA: Diagnosis not present

## 2017-11-05 ENCOUNTER — Ambulatory Visit (INDEPENDENT_AMBULATORY_CARE_PROVIDER_SITE_OTHER): Payer: BLUE CROSS/BLUE SHIELD | Admitting: Podiatry

## 2017-11-05 ENCOUNTER — Encounter: Payer: Self-pay | Admitting: Podiatry

## 2017-11-05 DIAGNOSIS — M76822 Posterior tibial tendinitis, left leg: Secondary | ICD-10-CM

## 2017-11-05 NOTE — Patient Instructions (Signed)
Pre-Operative Instructions  Congratulations, you have decided to take an important step towards improving your quality of life.  You can be assured that the doctors and staff at [facility] will be with you every step of the way.  Here are some important things you should know:  1. Plan to be at the surgery center/hospital at least 1 (one) hour prior to your scheduled time, unless otherwise directed by the surgical center/hospital staff.  You must have a responsible adult accompany you, remain during the surgery and drive you home.  Make sure you have directions to the surgical center/hospital to ensure you arrive on time. 2. If you are having surgery at [facility] or [facility], you will need a copy of your medical history and physical form from your family physician within one month prior to the date of surgery. We will give you a form for your primary physician to complete.  3. We make every effort to accommodate the date you request for surgery.  However, there are times where surgery dates or times have to be moved.  We will contact you as soon as possible if a change in schedule is required.   4. No aspirin/ibuprofen for one week before surgery.  If you are on aspirin, any non-steroidal anti-inflammatory medications (Mobic, Aleve, Ibuprofen) should not be taken seven (7) days prior to your surgery.  You make take Tylenol for pain prior to surgery.  5. Medications - If you are taking daily heart and blood pressure medications, seizure, reflux, allergy, asthma, anxiety, pain or diabetes medications, make sure you notify the surgery center/hospital before the day of surgery so they can tell you which medications you should take or avoid the day of surgery. 6. No food or drink after midnight the night before surgery unless directed otherwise by surgical center/hospital staff. 7. No alcoholic beverages 24-hours prior to surgery.  No smoking 24-hours prior or 24-hours after  surgery. 8. Wear loose pants or shorts. They should be loose enough to fit over bandages, boots, and casts. 9. Don't wear slip-on shoes. Sneakers are preferred. 10. Bring your boot with you to the surgery center/hospital.  Also bring crutches or a walker if your physician has prescribed it for you.  If you do not have this equipment, it will be provided for you after surgery. 11. If you have not been contacted by the surgery center/hospital by the day before your surgery, call to confirm the date and time of your surgery. 12. Leave-time from work may vary depending on the type of surgery you have.  Appropriate arrangements should be made prior to surgery with your employer. 13. Prescriptions will be provided immediately following surgery by your doctor.  Fill these as soon as possible after surgery and take the medication as directed. Pain medications will not be refilled on weekends and must be approved by the doctor. 14. Remove nail polish on the operative foot and avoid getting pedicures prior to surgery. 15. Wash the night before surgery.  The night before surgery wash the foot and leg well with water and the antibacterial soap provided. Be sure to pay special attention to beneath the toenails and in between the toes.  Wash for at least three (3) minutes. Rinse thoroughly with water and dry well with a towel.  Perform this wash unless told not to do so by your physician.  Enclosed: 1 Ice pack (please put in freezer the night before surgery)   1 Hibiclens skin cleaner     Pre-op instructions  If you have any questions regarding the instructions, please do not hesitate to call our office.  [place]: [address], [place], [place] [address] -- [phone]  [place]: [address] , [place] [address] -- [phone]  [place]: [address]  [place], [place] [address] -- [phone]  [place]: [address], [address], [place], [place] [address] -- [phone]  Website:  [email or web address] 

## 2017-11-05 NOTE — Progress Notes (Signed)
She presents today for her MRI report. She states that her foot is still hurting her and she is ready to get this thing fixed.  Objective: Vital signs are stable she is alert and oriented 3. I have reviewed her past medical history medications allergy surgery social history and review of systems. I have also reviewed her social history.  Pulses are strongly palpable she is alert and oriented 3 neurologic sensorium is intact in tendon reflexes are intact. She still has a weak posterior tibial tendon with pain on palpation posterior tibial tendon and just beneath the head of the talus. The MRI states that she has chronic posterior tibial tendinitis and possible tears within the tendon itself. She also states that there is a tear in the spring ligament. The patient has moderate to severe flatfoot deformity  Assessment: Pes planus with posterior tibial tendon dysfunction left area also a tear in the spring ligament left.  Plan: Discussed etiology pathology conservative versus surgical therapies. At this point probably recommend surgical intervention consisting of a Kidner procedure and a primary repair of the posterior tibial tendon as well as primary repair of the spring ligament. At this point we will try to abduct her foot to some degree to help with the plantar bowing of the arch itself. We consented her today for these procedures we did discuss the possible postop complications which may include but are not limited to postop pain bleeding swelling infection recurrence and the need for further surgery. I also expressed to her that this would not correct her flatfoot deformity but would help hold her foot in a better position. This will also take away the pain along the medial longitudinal arch and ankle. She understands this and is amenable to it we did discuss the surgery center the outpatient surgery center as well as anesthesia. She understands this and will follow up with me in the near future for  surgical intervention.

## 2017-11-12 DIAGNOSIS — F4322 Adjustment disorder with anxiety: Secondary | ICD-10-CM | POA: Diagnosis not present

## 2017-11-24 DIAGNOSIS — F4322 Adjustment disorder with anxiety: Secondary | ICD-10-CM | POA: Diagnosis not present

## 2017-11-28 HISTORY — PX: FOOT TENDON SURGERY: SHX958

## 2017-12-03 DIAGNOSIS — F4322 Adjustment disorder with anxiety: Secondary | ICD-10-CM | POA: Diagnosis not present

## 2017-12-10 DIAGNOSIS — F4322 Adjustment disorder with anxiety: Secondary | ICD-10-CM | POA: Diagnosis not present

## 2017-12-11 ENCOUNTER — Telehealth: Payer: Self-pay | Admitting: *Deleted

## 2017-12-11 NOTE — Telephone Encounter (Signed)
"  I'm scheduled for surgery on next Friday with Dr. Al CorpusHyatt.  I know I'm having surgery on a torn tendon but he had said there was a slight problem going on with my ACL.  He said if it wasn't bothering me, not to fix it.  However, I noticed the other day after I wore a boot, it was tender in that area.  So I want to know if he can go ahead and fix that while I'm in surgery on Friday."  I suggest you see him to discuss this.  If he feels it's necessary he can add it to your consent form and get you to sign it again.  "Okay, I'm fine with that.  I'd be happy to see him about it."  I'm going to transfer you to a scheduler to make an appointment.  [facility] so she can schedule an appointment..Marland Kitchen

## 2017-12-15 ENCOUNTER — Ambulatory Visit (INDEPENDENT_AMBULATORY_CARE_PROVIDER_SITE_OTHER): Payer: BLUE CROSS/BLUE SHIELD | Admitting: Podiatry

## 2017-12-15 ENCOUNTER — Encounter: Payer: Self-pay | Admitting: Podiatry

## 2017-12-15 ENCOUNTER — Encounter: Payer: Self-pay | Admitting: *Deleted

## 2017-12-15 DIAGNOSIS — S86112A Strain of other muscle(s) and tendon(s) of posterior muscle group at lower leg level, left leg, initial encounter: Secondary | ICD-10-CM | POA: Diagnosis not present

## 2017-12-15 NOTE — Progress Notes (Signed)
She presents today for a concern about her Achilles tendon pain.  She is having a Kidner procedure performed this Friday but is concerned about the Achilles tendon pain.  Wants to know if we need to add that to her consent.  She demonstrates the area that is painful for her today which is actually the posterior tibial tendinitis of the Achilles tendon.  Objective: Minimal pain on palpation of the Achilles tendon.  MRI recommends or suggests that the Achilles tendinitis insertional in nature.  And is minimal.  Assessment: Marginal Achilles tendinitis with significant tear of the posterior tibial tendon and tendinitis.  Plan: Currently we will proceed with current indications for surgery.

## 2017-12-16 ENCOUNTER — Telehealth: Payer: Self-pay | Admitting: *Deleted

## 2017-12-16 ENCOUNTER — Other Ambulatory Visit: Payer: Self-pay | Admitting: Podiatry

## 2017-12-16 MED ORDER — PROMETHAZINE HCL 25 MG PO TABS: 25.0000 mg | ORAL_TABLET | Freq: Three times a day (TID) | ORAL | 0 refills | Status: DC | PRN

## 2017-12-16 MED ORDER — HYDROMORPHONE HCL 4 MG PO TABS: 4.0000 mg | ORAL_TABLET | ORAL | 0 refills | Status: DC | PRN

## 2017-12-16 MED ORDER — CEPHALEXIN 500 MG PO CAPS: 500.0000 mg | ORAL_CAPSULE | Freq: Three times a day (TID) | ORAL | 0 refills | Status: DC

## 2017-12-16 NOTE — Telephone Encounter (Signed)
I called Dr. Lajuana MatteVia's office regarding medical clearance.  I spoke to Colmesneilhristy.  She said she would give the message to Dr. Susa SimmondsVia.  She said the patient may need to be seen first before he can give the clearance.  I called the patient and informed her that I called Dr. Lajuana MatteVia's office to get medical clearance and I was told her that she may need to be seen before he will give clearance because she has not been seen in a while.  She stated she saw him two weeks ago.  She said he told her to stop the Eliquis three days prior to surgery date and he told her to stop taking the iron because her levels were good and she didn't need it anymore.  She also stated she had problems with them before in the past, when she was having surgery, with them completing things that needed to be done.  I asked her to call them and see what she could do.  She said she would call them right away.

## 2017-12-17 NOTE — Telephone Encounter (Signed)
I called and informed Hennessy that we still hadn't received anything from Dr. Lajuana MatteVia's office.  She stated she called and spoke to the nurse Christy yesterday and they said he would send something.  She stated she would call them again now.

## 2017-12-17 NOTE — Telephone Encounter (Addendum)
"  We have an office with several fax lines.  Can you possibly send the medical clearance request directly to me so I can make sure Dr. Susa SimmondsVia gets it.  The patient called here very upset, so I want to make sure this is taken care of."  Yes, I will fax it.  "Send it to [phone].  Put it to the attention of Morrie Sheldonshley."  I will send it.  All we need is a statement from Dr. Susa SimmondsVia stating the patient is medically cleared to have surgery there is not a form that needs to be filled out.  [facility]. [facility]ajuana MatteVia's office.  "This is Felicia, I just faxed Dr. Lajuana MatteVia's response to you.  Can you make sure you have it before I hang up?"  Yes, I have it thank you so much.  "Can you call the patient and let her know that you received it?  He said she needs to stop it a day before surgery date."  Yes, I will call the patient and inform her.  She said he had already told her to stop it three days prior to her surgery date so, she has not taken it the past couple of days.  I am calling to let you know we received medical clearance from Dr. Susa SimmondsVia.  "Good I am glad.  I was upset because this is the second time this has happened.  It happened a couple of months ago."

## 2017-12-18 ENCOUNTER — Encounter: Payer: Self-pay | Admitting: Podiatry

## 2017-12-18 DIAGNOSIS — I10 Essential (primary) hypertension: Secondary | ICD-10-CM | POA: Diagnosis not present

## 2017-12-18 DIAGNOSIS — M25572 Pain in left ankle and joints of left foot: Secondary | ICD-10-CM | POA: Diagnosis not present

## 2017-12-18 DIAGNOSIS — Y929 Unspecified place or not applicable: Secondary | ICD-10-CM | POA: Diagnosis not present

## 2017-12-18 DIAGNOSIS — S93492A Sprain of other ligament of left ankle, initial encounter: Secondary | ICD-10-CM | POA: Diagnosis not present

## 2017-12-18 DIAGNOSIS — Y939 Activity, unspecified: Secondary | ICD-10-CM | POA: Diagnosis not present

## 2017-12-18 DIAGNOSIS — M67872 Other specified disorders of synovium, left ankle and foot: Secondary | ICD-10-CM | POA: Diagnosis not present

## 2017-12-18 DIAGNOSIS — M76822 Posterior tibial tendinitis, left leg: Secondary | ICD-10-CM | POA: Diagnosis not present

## 2017-12-18 DIAGNOSIS — Y999 Unspecified external cause status: Secondary | ICD-10-CM | POA: Diagnosis not present

## 2017-12-18 DIAGNOSIS — S86112D Strain of other muscle(s) and tendon(s) of posterior muscle group at lower leg level, left leg, subsequent encounter: Secondary | ICD-10-CM | POA: Diagnosis not present

## 2017-12-18 DIAGNOSIS — T1490XA Injury, unspecified, initial encounter: Secondary | ICD-10-CM | POA: Diagnosis not present

## 2017-12-24 ENCOUNTER — Ambulatory Visit (INDEPENDENT_AMBULATORY_CARE_PROVIDER_SITE_OTHER): Payer: BLUE CROSS/BLUE SHIELD | Admitting: Podiatry

## 2017-12-24 ENCOUNTER — Encounter: Payer: Self-pay | Admitting: Podiatry

## 2017-12-24 ENCOUNTER — Other Ambulatory Visit: Payer: BLUE CROSS/BLUE SHIELD

## 2017-12-24 VITALS — BP 92/65 | HR 95 | Temp 98.8°F

## 2017-12-24 DIAGNOSIS — M66872 Spontaneous rupture of other tendons, left ankle and foot: Secondary | ICD-10-CM

## 2017-12-24 NOTE — Progress Notes (Signed)
She presents today 1 week status post peroneal tendon repair spring ligament repair posterior tibial tendon advancement with a transfer of the FDL.  She was also placed in a cast.  She denies calf pain chest pain shortness of breath.  States that is doing better than I thought it would be doing.  She states that it really is not hurting and she is not having to take much pain medicine.  States that she had a little bit of dermatitis associated with the antibiotic she feels.  Objective: Vital signs are stable alert and oriented x3.  Pulses are palpable.  No erythema cellulitis drainage or odor to the toes.  Cast is intact appears to be loose but she has good range of motion of the toes sensation is good and it is keeping her foot slightly inverted.  Assessment: Well-healing surgical foot.  Plan: Follow-up with her in 1 week for cast change.

## 2017-12-31 ENCOUNTER — Encounter: Payer: Self-pay | Admitting: Podiatry

## 2017-12-31 ENCOUNTER — Ambulatory Visit (INDEPENDENT_AMBULATORY_CARE_PROVIDER_SITE_OTHER): Payer: BLUE CROSS/BLUE SHIELD | Admitting: Podiatry

## 2017-12-31 VITALS — BP 124/77 | HR 74 | Temp 97.9°F

## 2017-12-31 DIAGNOSIS — M76822 Posterior tibial tendinitis, left leg: Secondary | ICD-10-CM

## 2017-12-31 DIAGNOSIS — S86112A Strain of other muscle(s) and tendon(s) of posterior muscle group at lower leg level, left leg, initial encounter: Secondary | ICD-10-CM

## 2018-01-01 ENCOUNTER — Ambulatory Visit (INDEPENDENT_AMBULATORY_CARE_PROVIDER_SITE_OTHER): Payer: BLUE CROSS/BLUE SHIELD

## 2018-01-01 DIAGNOSIS — M76822 Posterior tibial tendinitis, left leg: Secondary | ICD-10-CM | POA: Diagnosis not present

## 2018-01-01 NOTE — Progress Notes (Signed)
Patient presents stating that the cast is too tight around the top of her foot. She states that it feels uncomfortable even when elevated.   Cast was bivalved, patient stated relief and comfort with cast.   She is to keep regularly scheduled appointment or call if needed

## 2018-01-02 NOTE — Progress Notes (Signed)
She presents today date of surgery 12/18/2017 status post repair of peroneal tendon and spring ligament left foot.  Kidner procedure was performed.  She denies fever chills nausea vomiting muscle aches and pains pain shortness of breath or chest pain.  Objective: Vital signs are stable alert and oriented x3.  Pulses are palpable.  Neurologic sensorium is intact cast had been removed we removed many of the sutures today margins are well coapted.  She has good range of motion of the foot.  Assessment: Well-healing surgical foot.  Plan: Redressed today after staples were removed and recasted.

## 2018-01-04 DIAGNOSIS — F4322 Adjustment disorder with anxiety: Secondary | ICD-10-CM | POA: Diagnosis not present

## 2018-01-11 DIAGNOSIS — F419 Anxiety disorder, unspecified: Secondary | ICD-10-CM | POA: Diagnosis not present

## 2018-01-14 ENCOUNTER — Encounter: Payer: Self-pay | Admitting: Podiatry

## 2018-01-14 ENCOUNTER — Ambulatory Visit (INDEPENDENT_AMBULATORY_CARE_PROVIDER_SITE_OTHER): Payer: BLUE CROSS/BLUE SHIELD | Admitting: Podiatry

## 2018-01-14 VITALS — BP 134/91 | HR 91 | Temp 98.2°F

## 2018-01-14 DIAGNOSIS — S86112A Strain of other muscle(s) and tendon(s) of posterior muscle group at lower leg level, left leg, initial encounter: Secondary | ICD-10-CM | POA: Diagnosis not present

## 2018-01-14 NOTE — Progress Notes (Signed)
She presents today for postop visit.  Date of surgery 12/18/2017 repair posterior tibial tendon left.  She states that her foot is not hurting at all any longer.  Cast was intact dry and clean presents walking nonweightbearing crutches.  Objective: Vital signs are stable alert and oriented x3 denies calf pain chest pain shortness of breath.  She states that she uses her scooter and her crutches at all times is not that the foot down.  Cast was removed today demonstrating no calf pain she has good range of motion of the ankle joint and subtalar joint.  No pain on palpation on range of motion.  Staples were removed in total margins are well coapted.  No erythema no sign of any infection no purulence no malodor.  Assessment: Well-healing surgical foot.  Plan: Place her in a compression anklet today I will allow her to start washing this in a nonweightbearing stance tomorrow.  She will continue to wear the compression anklet and the Cam walker which was dispensed today.  She will remain nonweightbearing for at least another 2 weeks I will reevaluate her at that time.  She will call with any questions or concerns.

## 2018-01-19 DIAGNOSIS — F419 Anxiety disorder, unspecified: Secondary | ICD-10-CM | POA: Diagnosis not present

## 2018-01-25 DIAGNOSIS — F4322 Adjustment disorder with anxiety: Secondary | ICD-10-CM | POA: Diagnosis not present

## 2018-01-26 ENCOUNTER — Ambulatory Visit (INDEPENDENT_AMBULATORY_CARE_PROVIDER_SITE_OTHER): Payer: BLUE CROSS/BLUE SHIELD | Admitting: Podiatry

## 2018-01-26 DIAGNOSIS — S86112A Strain of other muscle(s) and tendon(s) of posterior muscle group at lower leg level, left leg, initial encounter: Secondary | ICD-10-CM

## 2018-01-26 NOTE — Progress Notes (Signed)
She presents today for postop evaluation date of surgery 12/18/2017 repair of the posterior tibial tendon left foot.  She states that it has not hurt a bit she is been wearing the Cam walker but she has not been putting pressure on the foot.  She denies calf pain chest pain shortness of breath.  She states that she is ready to start walking.  Objective: Vital signs are stable she is alert and oriented x3.  There is no erythema there is minimal edema no cellulitis drainage or odor she has great range of motion inversion and eversion against resistance dorsiflexion and plantarflexion of the left ankle.  Posterior tibial tendon is palpable and appears to be in good position.  Assessment: Well-healing surgical foot repair posterior tibial tendon left.  Plan: At this point I demonstrated to her how I expect her to start partial weightbearing.  She understands this and is amenable to it.  I will follow-up with her in 2 weeks at which time she should be walking and we are going to try to get her into a Tri-Lock brace at that time.

## 2018-02-09 ENCOUNTER — Other Ambulatory Visit: Payer: BLUE CROSS/BLUE SHIELD

## 2018-02-17 DIAGNOSIS — Z1231 Encounter for screening mammogram for malignant neoplasm of breast: Secondary | ICD-10-CM | POA: Diagnosis not present

## 2018-02-18 ENCOUNTER — Encounter: Payer: Self-pay | Admitting: Podiatry

## 2018-02-18 ENCOUNTER — Ambulatory Visit (INDEPENDENT_AMBULATORY_CARE_PROVIDER_SITE_OTHER): Payer: BLUE CROSS/BLUE SHIELD | Admitting: Podiatry

## 2018-02-18 DIAGNOSIS — M66872 Spontaneous rupture of other tendons, left ankle and foot: Secondary | ICD-10-CM

## 2018-02-21 NOTE — Progress Notes (Signed)
She presents today date of surgery 12/18/2017 status post posterior tibial tendon repair left foot.  States that is doing much better.  Objective: Vital signs are stable alert and oriented x3.  Pulses are palpable.  His great range of motion minimal edema.  Some mild tenderness on palpation of the incision site.  Assessment: Well-healing surgical foot.  Plan: I will her in a Tri-Lock brace ambulating without her crutches.  We discussed this in great detail today she understands that and is amenable to it.  I will follow-up with her in 3-4 weeks.

## 2018-03-01 DIAGNOSIS — F419 Anxiety disorder, unspecified: Secondary | ICD-10-CM | POA: Diagnosis not present

## 2018-03-15 DIAGNOSIS — F419 Anxiety disorder, unspecified: Secondary | ICD-10-CM | POA: Diagnosis not present

## 2018-03-18 ENCOUNTER — Ambulatory Visit: Payer: BLUE CROSS/BLUE SHIELD | Admitting: Podiatry

## 2018-04-05 DIAGNOSIS — F419 Anxiety disorder, unspecified: Secondary | ICD-10-CM | POA: Diagnosis not present

## 2018-04-06 ENCOUNTER — Ambulatory Visit: Payer: BLUE CROSS/BLUE SHIELD | Admitting: Podiatry

## 2018-04-08 ENCOUNTER — Encounter: Payer: Self-pay | Admitting: Podiatry

## 2018-04-08 ENCOUNTER — Ambulatory Visit (INDEPENDENT_AMBULATORY_CARE_PROVIDER_SITE_OTHER): Payer: BLUE CROSS/BLUE SHIELD | Admitting: Podiatry

## 2018-04-08 DIAGNOSIS — M66872 Spontaneous rupture of other tendons, left ankle and foot: Secondary | ICD-10-CM

## 2018-04-08 NOTE — Progress Notes (Signed)
She presents today for follow-up of her posterior tibial tendon repair left.  Date of surgery December 18, 2017.  States that is a little slow still wearing the boot but it is getting there.  The brace is the only thing I wear him in the house other than a tennis shoe.  I do not wear the boot in the house.  The brace really seems to bother her foot.  Objective: Vital signs are stable alert and oriented x3.  Pulses are palpable.  Neurologic sensorium is intact.  Mild edema about the left foot.  Pes planus is noted left foot.  She has good inversion against resistance which is full and strong.  Assessment: I expressed to her that I would like for her to go ahead and start walking with this with a shoe on she understands this is amenable to it we did talk about shoe styles and different types of shoes it would be best for this.  Plan: I will follow-up with her in 1 month after she has started walking with shoes.

## 2018-05-06 ENCOUNTER — Encounter: Payer: BLUE CROSS/BLUE SHIELD | Admitting: Podiatry

## 2018-05-17 DIAGNOSIS — F419 Anxiety disorder, unspecified: Secondary | ICD-10-CM | POA: Diagnosis not present

## 2018-05-20 ENCOUNTER — Ambulatory Visit (INDEPENDENT_AMBULATORY_CARE_PROVIDER_SITE_OTHER): Payer: BLUE CROSS/BLUE SHIELD | Admitting: Podiatry

## 2018-05-20 DIAGNOSIS — M66872 Spontaneous rupture of other tendons, left ankle and foot: Secondary | ICD-10-CM

## 2018-05-20 NOTE — Progress Notes (Signed)
She presents today for her final postop visit regarding repair of the posterior tibial tendon of the left foot.  She states that she is doing quite well and is very excited about her foot there is no longer is painful.  Objective: Vital signs stable she is alert and oriented x3 no erythema edema cellulitis drainage or odor she says flatfoot deformity but much decrease in edema along posterior tibial tendon.  He still has good inversion against resistance and no tenderness on palpation of the posterior tibial tendon.  Assessment: Well-healed posterior tibial tendon left.  Plan: Follow-up with me on an as-needed basis.  Encouraged her to continue to wear corrective shoes and orthotics.

## 2018-05-27 DIAGNOSIS — F419 Anxiety disorder, unspecified: Secondary | ICD-10-CM | POA: Diagnosis not present

## 2018-06-02 DIAGNOSIS — I83813 Varicose veins of bilateral lower extremities with pain: Secondary | ICD-10-CM | POA: Diagnosis not present

## 2018-06-07 DIAGNOSIS — F419 Anxiety disorder, unspecified: Secondary | ICD-10-CM | POA: Diagnosis not present

## 2018-06-21 DIAGNOSIS — F419 Anxiety disorder, unspecified: Secondary | ICD-10-CM | POA: Diagnosis not present

## 2018-06-24 DIAGNOSIS — Z0000 Encounter for general adult medical examination without abnormal findings: Secondary | ICD-10-CM | POA: Diagnosis not present

## 2018-06-24 DIAGNOSIS — D509 Iron deficiency anemia, unspecified: Secondary | ICD-10-CM | POA: Diagnosis not present

## 2018-06-24 DIAGNOSIS — I8392 Asymptomatic varicose veins of left lower extremity: Secondary | ICD-10-CM | POA: Diagnosis not present

## 2018-06-24 DIAGNOSIS — I10 Essential (primary) hypertension: Secondary | ICD-10-CM | POA: Diagnosis not present

## 2018-06-30 DIAGNOSIS — N841 Polyp of cervix uteri: Secondary | ICD-10-CM | POA: Diagnosis not present

## 2018-06-30 DIAGNOSIS — N72 Inflammatory disease of cervix uteri: Secondary | ICD-10-CM | POA: Diagnosis not present

## 2018-06-30 DIAGNOSIS — Z01419 Encounter for gynecological examination (general) (routine) without abnormal findings: Secondary | ICD-10-CM | POA: Diagnosis not present

## 2018-06-30 DIAGNOSIS — Z6837 Body mass index (BMI) 37.0-37.9, adult: Secondary | ICD-10-CM | POA: Diagnosis not present

## 2018-07-28 DIAGNOSIS — L810 Postinflammatory hyperpigmentation: Secondary | ICD-10-CM | POA: Diagnosis not present

## 2018-07-28 DIAGNOSIS — L281 Prurigo nodularis: Secondary | ICD-10-CM | POA: Diagnosis not present

## 2018-08-12 DIAGNOSIS — F419 Anxiety disorder, unspecified: Secondary | ICD-10-CM | POA: Diagnosis not present

## 2018-09-02 DIAGNOSIS — F419 Anxiety disorder, unspecified: Secondary | ICD-10-CM | POA: Diagnosis not present

## 2018-09-27 DIAGNOSIS — F419 Anxiety disorder, unspecified: Secondary | ICD-10-CM | POA: Diagnosis not present

## 2018-10-05 ENCOUNTER — Telehealth: Payer: Self-pay

## 2018-10-05 NOTE — Telephone Encounter (Signed)
SENT REFERRAL TO SCHEDULING AND FILED NOTES 

## 2018-10-06 DIAGNOSIS — L281 Prurigo nodularis: Secondary | ICD-10-CM | POA: Diagnosis not present

## 2018-10-06 DIAGNOSIS — Z86718 Personal history of other venous thrombosis and embolism: Secondary | ICD-10-CM | POA: Diagnosis not present

## 2018-10-06 DIAGNOSIS — Z8249 Family history of ischemic heart disease and other diseases of the circulatory system: Secondary | ICD-10-CM | POA: Diagnosis not present

## 2018-10-06 DIAGNOSIS — Z86711 Personal history of pulmonary embolism: Secondary | ICD-10-CM | POA: Diagnosis not present

## 2018-10-12 DIAGNOSIS — F419 Anxiety disorder, unspecified: Secondary | ICD-10-CM | POA: Diagnosis not present

## 2018-10-20 DIAGNOSIS — R0602 Shortness of breath: Secondary | ICD-10-CM | POA: Diagnosis not present

## 2018-10-25 DIAGNOSIS — R0602 Shortness of breath: Secondary | ICD-10-CM | POA: Diagnosis not present

## 2018-10-26 ENCOUNTER — Encounter: Payer: Self-pay | Admitting: Hematology and Oncology

## 2018-10-26 ENCOUNTER — Telehealth: Payer: Self-pay | Admitting: Hematology and Oncology

## 2018-10-26 DIAGNOSIS — F419 Anxiety disorder, unspecified: Secondary | ICD-10-CM | POA: Diagnosis not present

## 2018-10-26 NOTE — Telephone Encounter (Signed)
Received a new referral from Dr. Susa Simmonds for personal hx of pe and dvt. Pt has been cld and scheduled to see Dr. Caron Presume on 11/13 at 1pm. Letter mailed.

## 2018-10-29 DIAGNOSIS — R0602 Shortness of breath: Secondary | ICD-10-CM | POA: Diagnosis not present

## 2018-11-04 DIAGNOSIS — F419 Anxiety disorder, unspecified: Secondary | ICD-10-CM | POA: Diagnosis not present

## 2018-11-10 ENCOUNTER — Telehealth: Payer: Self-pay

## 2018-11-10 ENCOUNTER — Encounter: Payer: Self-pay | Admitting: Hematology and Oncology

## 2018-11-10 ENCOUNTER — Inpatient Hospital Stay: Payer: BLUE CROSS/BLUE SHIELD | Attending: Hematology and Oncology | Admitting: Hematology and Oncology

## 2018-11-10 VITALS — BP 134/77 | HR 70 | Temp 98.0°F | Resp 18 | Ht 67.0 in | Wt 248.8 lb

## 2018-11-10 DIAGNOSIS — N644 Mastodynia: Secondary | ICD-10-CM | POA: Diagnosis not present

## 2018-11-10 DIAGNOSIS — Z86718 Personal history of other venous thrombosis and embolism: Secondary | ICD-10-CM

## 2018-11-10 DIAGNOSIS — Z86711 Personal history of pulmonary embolism: Secondary | ICD-10-CM

## 2018-11-10 DIAGNOSIS — Z79899 Other long term (current) drug therapy: Secondary | ICD-10-CM | POA: Diagnosis not present

## 2018-11-10 DIAGNOSIS — D6859 Other primary thrombophilia: Secondary | ICD-10-CM | POA: Diagnosis not present

## 2018-11-10 DIAGNOSIS — Z9884 Bariatric surgery status: Secondary | ICD-10-CM | POA: Diagnosis not present

## 2018-11-10 DIAGNOSIS — D509 Iron deficiency anemia, unspecified: Secondary | ICD-10-CM

## 2018-11-10 DIAGNOSIS — I2782 Chronic pulmonary embolism: Secondary | ICD-10-CM

## 2018-11-10 NOTE — Telephone Encounter (Signed)
Per 11/13 los patient has another appointment and will return on 11/14 for her lab add on. Printed avs and calender of upcoming appointment.

## 2018-11-10 NOTE — Patient Instructions (Signed)
We discussed in detail the role and rationale for testing for a hereditary predisposition to blood clot formation.  Even if you have no hereditary factors that put you at risk for blood clots, it does not necessarily mean that you should stop anticoagulation.  That decision can be made following your blood test and discussion.  There is no suggestion on physical examination or in your prior laboratory studies which suggest an underlying lymphoproliferative disorder.  Your complete blood count and comprehensive metabolic panel were normal.  Barring any on proceeding complications, a follow-up visit has been scheduled on December 5 to discuss those results.  Please do not hesitate to call should any new or untoward problems arise in the interim.  Thank you for coming in today! Debbe Odeaichard Tramaine Snell, MD Hematology/Oncology

## 2018-11-11 ENCOUNTER — Inpatient Hospital Stay: Payer: BLUE CROSS/BLUE SHIELD

## 2018-11-11 DIAGNOSIS — Z9884 Bariatric surgery status: Secondary | ICD-10-CM | POA: Diagnosis not present

## 2018-11-11 DIAGNOSIS — Z79899 Other long term (current) drug therapy: Secondary | ICD-10-CM | POA: Diagnosis not present

## 2018-11-11 DIAGNOSIS — D6859 Other primary thrombophilia: Secondary | ICD-10-CM

## 2018-11-11 DIAGNOSIS — N644 Mastodynia: Secondary | ICD-10-CM | POA: Diagnosis not present

## 2018-11-11 DIAGNOSIS — Z86711 Personal history of pulmonary embolism: Secondary | ICD-10-CM | POA: Diagnosis not present

## 2018-11-11 DIAGNOSIS — Z86718 Personal history of other venous thrombosis and embolism: Secondary | ICD-10-CM | POA: Diagnosis not present

## 2018-11-11 DIAGNOSIS — D509 Iron deficiency anemia, unspecified: Secondary | ICD-10-CM | POA: Diagnosis not present

## 2018-11-11 DIAGNOSIS — I2782 Chronic pulmonary embolism: Secondary | ICD-10-CM

## 2018-11-12 LAB — BETA-2-GLYCOPROTEIN I ABS, IGG/M/A
Beta-2 Glyco I IgG: <9 GPI IgG units (ref 0–20)
Beta-2-Glycoprotein I IgA: <9 GPI IgA units (ref 0–25)
Beta-2-Glycoprotein I IgM: <9 GPI IgM units (ref 0–32)

## 2018-11-12 LAB — CARDIOLIPIN ANTIBODIES, IGG, IGM, IGA
Anticardiolipin IgA: <9 APL U/mL (ref 0–11)
Anticardiolipin IgG: <9 GPL U/mL (ref 0–14)
Anticardiolipin IgM: 16 MPL U/mL — ABNORMAL HIGH (ref 0–12)

## 2018-11-12 LAB — LUPUS ANTICOAGULANT PANEL
DRVVT: 45.1 s (ref 0.0–47.0)
PTT Lupus Anticoagulant: 35 s (ref 0.0–51.9)

## 2018-11-12 LAB — HOMOCYSTEINE: Homocysteine: 10.3 umol/L (ref 0.0–15.0)

## 2018-11-14 NOTE — Progress Notes (Signed)
[facility] Outpatient Hematology/Oncology Initial Consultation  Patient Name:  Jenna Davenport  DOB: [birth year]   Date of Service: November 10, 2018  Referring Provider: Iva Boop, MD [address] [place], [place] [address]   Consulting Physician: Toni Arthurs, MD Hematology/Oncology  Reason for Referral: In the setting of prior bilateral pulmonary emboli and left lower extremity deep vein thrombosis on November 11, 2016; presumably with a family history of "blood clots;" currently on apixaban, she presents now for further discussion and recommendations regarding the duration of anticoagulation and the presence of any hypercoagulable defect.    History Present Illness: Jenna Davenport is a 47 year old resident of [place] whose past medical history is significant for primary hypertension not on medication; elevated BMI; sinus tachyarrhythmia; previous gastric sleeve; herpes simplex type II on prophylaxis; iron deficiency anemia on ferrous sulfate; and prurigo nodularis nodular skin lesions, on hydroxychloroquine. Her primary care provider is Dr. Caryn Bee Via.  She is alone at this first visit.  Her prior laboratory studies from [place] at [facility] revealed on June 24 2018: A complete blood count showed hemoglobin 12.9 hematocrit 38.8 MCV 88.3 MCH 29.4 RDW 14.8 WBC 4.4; platelets 279,000.  November 08, 2016 she presented to the emergency department complaining of acute pain under her right breast radiating to the right flank for at least 7 days.  The pain was described as severe and radiating down the right flank worse on deep inspiration and cough.  At the time she had no palpitations or swelling of the lower extremities.  She works at home and her lifestyle is fairly sedentary spending hours in front of a computer and going several days at a time without leaving the house. She was not using oral contraception; she denied any recent long distance travel; or recent surgery  or trauma.  Initially a noncontrast CT was performed to rule out renal calculi.  There were bibasilar pneumonic consolidations (right > left) with a small right-sided parapneumonic effusion was identified.  CT thoracic angiography with intravenous contrast was performed on November 08, 2016.  The study was somewhat limited with apparent small volume within the left upper lobe pulmonary embolus involving segmental left lower lobe arteries.  There was bibasilar collapse/consolidation right greater than sign left with a small right pleural effusion.  Those results are detailed below.  On November 09, 2016 a duplex venous Doppler was performed on the left lower extremity.  There was evidence of an acute deep vein thrombosis involving the left popliteal vein in the lower extremity.  On November 11, 2016 she was discharged in stable condition on apixaban. She continues on apixaban to the present. She has had no bleeding tendency with apixaban.  She has no postphlebitic syndrome. She has had no adverse reaction.  Although she states her family has "blood clots," both her mother and father had cerebrovascular disease or stroke syndromes.  Her brother at the age of 15 years has a recent diagnosis of non-Hodgkin lymphoma.  She has no diabetes mellitus, coronary artery disease, or cardiac dysrhythmia.  She denies seizure disorder or stroke syndrome.  She has no thyroid disease.  There is no peptic ulcer or gastroesophageal reflux disease.  She denies viral hepatitis, inflammatory bowel disease, or diverticulosis.  She has never had a screening colonoscopy.  She has no kidney or liver disease.  There is no rheumatoid or gouty arthritis.  For iron deficiency anemia, she has been on oral iron for the past 8 years with ferrous sulfate.  Over the past 12 months she is gained 10 pounds.  In 2011 she had a gastric bypass.  She reports no visual changes or hearing deficit.  She has no headache, dizziness, lightheadedness,  syncope, or near syncopal episodes.  She denies cough, sore throat, orthopnea.  She has no dyspnea either at rest or on exertion.  There is no pain or difficulty in swallowing.  No fever, shaking chills, sweats, or flulike symptoms are evident.  She denies heartburn or indigestion.  She has no nausea, vomiting, diarrhea, or constipation.  She moves her bowels once daily.  There is no urinary frequency, urgency, hematuria, or dysuria.  She denies swelling of her ankles.  She has no numbness or tingling in the fingers or toes.  She has no new arthralgias or myalgias.  It is with this background she presents now for further diagnostic and therapeutic recommendations regarding the duration of anticoagulation in the setting of a prior pulmonary emboli and left lower extremity deep vein thrombosis as outlined above.  Past Medical History:  Diagnosis Date  . Anemia   . Pulmonary embolism [facility])    Past Surgical History:  Procedure Laterality Date  . CHOLECYSTECTOMY     2007  . GASTRIC BYPASS     2011  . TUBAL LIGATION     2008   Gynecologic History: Her menarche was at age 25 years. Her last menstruation was September 2018. She is a gravida 3 para 2 abortion 1 She is not on estrogen replacement therapy. She was on oral contraception from age 2 to 18 years. She was on Depo-Provera from age 2 to age 29 years. Her last screening mammogram was in 2019, reportedly normal. Her last Pap smear was in 2019. She has no prior breast biopsy.  Family History  Problem Relation Age of Onset  . Seizures Mother   . Stroke Mother   . Heart failure Mother   . Hyperlipidemia Mother   . Hypertension Father   . Diabetes Father   . Seizures Father   . Hyperlipidemia Father   Mother: Age 26 years: CVA/HLD/thyroid disease/dementia/seizure disorder Father: Age 95 years: CVA/HTN/DM/HLD/PAD Brothers (1): Age 1 years: NHL/DM/HLD  Social History   Socioeconomic History  . Marital status: Divorced    Spouse  name: Not on file  . Number of children: Not on file  . Years of education: Not on file  . Highest education level: Not on file  Occupational History  . Not on file  Social Needs  . Financial resource strain: Not on file  . Food insecurity:    Worry: Not on file    Inability: Not on file  . Transportation needs:    Medical: Not on file    Non-medical: Not on file  Tobacco Use  . Smoking status: Never Smoker  . Smokeless tobacco: Never Used  Substance and Sexual Activity  . Alcohol use: Yes    Comment: occasionally  . Drug use: No  . Sexual activity: Not on file  Lifestyle  . Physical activity:    Days per week: Not on file    Minutes per session: Not on file  . Stress: Not on file  Relationships  . Social connections:    Talks on phone: Not on file    Gets together: Not on file    Attends religious service: Not on file    Active member of club or organization: Not on file    Attends meetings of clubs or organizations: Not on  file    Relationship status: Not on file  . Intimate partner violence:    Fear of current or ex partner: Not on file    Emotionally abused: Not on file    Physically abused: Not on file    Forced sexual activity: Not on file  Other Topics Concern  . Not on file  Social History Narrative  . Not on file  Jenna RootsDawn Davenport works for [employer] from home She is divorced She has 2 healthy children Her alcohol intake consist of wine mostly on weekends, never heavy She is a lifetime non-smoker In the past she has used marijuana She reports no use of e-cigarettes She states no other recreational drug use  Transfusion History: No prior transfusion  Exposure History: She has no prior exposure to toxic chemicals, radiation, or pesticides  Allergies  Allergen Reactions  . Pollen Extract Other (See Comments)    Itchy, watery eyes   She has no known medical allergies No food allergies  Current Outpatient Medications on File Prior to Visit   Medication Sig  . Multiple Vitamins-Minerals (HAIR SKIN AND NAILS FORMULA) TABS Take 2 tablets by mouth daily.  . valACYclovir (VALTREX) 1000 MG tablet Take 1,000 mg by mouth daily.  Marland Kitchen. acetaminophen-codeine (TYLENOL #3) 300-30 MG tablet   . acyclovir (ZOVIRAX) 400 MG tablet   . amLODipine (NORVASC) 5 MG tablet Take 1 tablet (5 mg total) by mouth daily.  Marland Kitchen. apixaban (ELIQUIS) 5 MG TABS tablet Take 2 tabs po BID thru 11/16/16 then take 1 tab po BID starting 11/17/16. (Patient taking differently: Take 5 mg by mouth daily. )  . ferrous sulfate 325 (65 FE) MG tablet Take 1 tablet (325 mg total) by mouth daily with breakfast.  . Multiple Vitamins-Minerals (MULTIVITAMIN GUMMIES ADULT PO) Take 2 tablets by mouth daily.    No current facility-administered medications on file prior to visit.     Review of Systems: Constitutional: No fever, sweats, or shaking chills. No appetite or weight deficit. Skin: No scaling, sores, lumps, or jaundice; prurigo nodularis on hydroxychloroquine. HEENT: No visual changes or hearing deficit; seasonal allergies. Pulmonary: No unusual cough, sore throat, or orthopnea. Breasts: No complaints. Cardiovascular: No coronary artery disease, angina, or myocardial infarction.  No cardiac dysrhythmia, essential hypertension, not on medication; no dyslipidemia. Gastrointestinal: No indigestion, dysphagia, abdominal pain, diarrhea, or constipation.  No change in bowel habits. No melena or bright red blood per rectum.  No nausea or vomiting. Gastric sleeve 2011. Genitourinary: No urinary frequency, urgency, hematuria, or dysuria; herpes simplex type II prophylaxis. Musculoskeletal: No arthralgias or myalgias; no joint swelling, pain, or instability. Hematologic: No bleeding tendency or easy bruisability; chronic iron deficiency without anemia on ferrous sulfate. Endocrine: No intolerance to hot or cold; no thyroid disease or diabetes mellitus. Vascular: No peripheral arterial  disease; acute DVT and PE in November 2017. Psychological: No anxiety, depression, or mood changes; no mental health illnesses. Neurological: No headache, dizziness, lightheadedness, syncope, or near syncopal episodes; no numbness or tingling in the fingers or toes.  Physical Examination: Vital Signs: Body surface area is 2.31 meters squared.  Vitals:   11/10/18 1319  BP: 134/77  Pulse: 70  Resp: 18  Temp: 98 F (36.7 C)  SpO2: 100%    Filed Weights   11/10/18 1319  Weight: 248 lb 12.8 oz (112.9 kg)  ECOG PERFORMANCE STATUS: 0 Constitutional:  Jenna Davenport is fully nourished and developed albeit overweight.  She looks age appropriate. She is friendly and cooperative  without respiratory compromise at rest. Skin: No rashes, scaling, dryness, jaundice, or itching. HEENT: Head is normocephalic and atraumatic. Pupils are equal round and reactive to light and accommodation.  Sclerae are anicteric.  Conjunctivae are pink.  No sinus tenderness nor oropharyngeal lesions.  Lips without cracking or peeling; tongue without mass, inflammation, or nodularity.  Mucous membranes are moist. Neck: Supple and symmetric.  No jugular venous distention or thyromegaly.  Trachea is midline. Lymphatics: No cervical or supraclavicular lymphadenopathy.  No epitrochlear, axillary, or inguinal lymphadenopathy is appreciated. Respiratory/chest: Thorax is symmetrical.  Breath sounds are clear to auscultation and percussion.  Normal excursion and respiratory effort. Back: Symmetric without deformity or tenderness. Cardiovascular: Heart rate and rhythm are regular without murmurs, gallops, or rubs. Gastrointestinal: Abdomen is soft, nontender; no organomegaly.  Bowel sounds are normoactive.  No masses are appreciated. Extremities: In the lower extremities, there is no asymmetric swelling, erythema, tenderness, or cord formation.  No clubbing, cyanosis, nor edema. Hematologic: No petechiae, hematomas, or  ecchymoses. Psychological:  She is oriented to person, place, and time; normal affect, memory, and cognition. Neurological: There are no gross neurologic deficits.  Laboratory Results: I have reviewed the data as listed: On June 24 2018: A complete blood count showed hemoglobin 12.9 hematocrit 38.8 MCV 88.3 MCH 29.4 RDW 14.8 WBC 4.4; platelets 279,000.    CMP Latest Ref Rng & Units 11/10/2016 11/09/2016 11/08/2016  Glucose 65 - 99 mg/dL 92 161(W) 960(A)  BUN 6 - 20 mg/dL 6 7 8   Creatinine 0.44 - 1.00 mg/dL 5.40 9.81 1.91  Sodium 135 - 145 mmol/L 138 137 134(L)  Potassium 3.5 - 5.1 mmol/L 3.9 4.2 4.2  Chloride 101 - 111 mmol/L 104 105 101  CO2 22 - 32 mmol/L 29 27 27   Calcium 8.9 - 10.3 mg/dL 4.7(W) 8.3(L) 8.7(L)  Total Protein 6.5 - 8.1 g/dL 7.5 - 8.3(H)  Total Bilirubin 0.3 - 1.2 mg/dL 0.4 - 0.6  Alkaline Phos 38 - 126 U/L 99 - 62  AST 15 - 41 U/L 183(H) - 22  ALT 14 - 54 U/L 59(H) - 11(L)   Diagnostic/Imaging Studies: On November 09, 2016 A 2D echocardiogram: Study Conclusions  - Left ventricle: The cavity size was normal. Wall thickness was increased in a pattern of severe LVH. Systolic function was normal. The estimated ejection fraction was in the range of 60% to 65%. Left ventricular diastolic function parameters were normal. - Aortic valve: Valve area (VTI): 3.22 cm^2. Valve area (Vmax): 3.09 cm^2. Valve area (Vmean): 3.23 cm^2. - Left atrium: The atrium was mildly dilated. - The aorta was normal, not dilated, and non-diseased.  November 08, 2016 Left lower extremity duplex venous Doppler: Summary: There is evidence of acute deep vein thrombosis involving the popliteal vein of the left lower extremity.  There is no evidence of superficial vein thrombosis involving the left lower extremity. There is no evidence of deep or superficial vein thrombosis involving the right lower extremity. There is no evidence of a Baker&'s cyst bilaterally. Other  specific details can be found in the table(s) above. Prepared and Electronically Authenticated by  Lemar Livings, MD November 08, 2016  CT ANGIOGRAPHY CHEST WITH CONTRAST  TECHNIQUE: Multidetector CT imaging of the chest was performed using the standard protocol during bolus administration of intravenous contrast. Multiplanar CT image reconstructions and MIPs were obtained to evaluate the vascular anatomy.  CONTRAST:  Contrast 100 cc Isovue 370  COMPARISON:  CT abdomen pelvis from earlier today.  FINDINGS: Cardiovascular: The heart  is enlarged. No pericardial effusion. No thoracic aortic aneurysm. No evidence for thoracic aortic dissection. Assessment of pulmonary arteries is limited by bolus timing and breathing motion, but the patient does appear to have segmental pulmonary embolus to the lingula (see image 99 series 10 and image 117 series 8).  Mediastinum/Nodes: No mediastinal lymphadenopathy. There is no hilar lymphadenopathy. The esophagus has normal imaging features. There is no axillary lymphadenopathy.  Lungs/Pleura: Right lower lobe collapse/ consolidation is associated with small right pleural effusion. Left lower lobe collapse/ consolidation noted without appreciable effusion.  Upper Abdomen: Small cyst noted upper pole left kidney. Patient is status post gastric bypass.  Musculoskeletal: Bone windows reveal no worrisome lytic or sclerotic osseous lesions.  Review of the MIP images confirms the above findings.  IMPRESSION: 1. Limited study with apparent small volume left upper lobe pulmonary embolus to segmental left lower lobe arteries. 2. Bibasilar collapse/consolidation, right greater than left with small right pleural effusion.  Critical Value/emergent results were called by telephone at the time of interpretation on 11/08/2016 at 11:38 pm to Dr. Geoffery Lyons , who verbally acknowledged these results.  Kennith Center M.D. 11/08/2016  23:39  Summary/Assessment: In the setting of prior bilateral pulmonary emboli and left lower extremity deep vein thrombosis on November 11, 2016; had a strong family history of "blood clots;" currently on apixaban, she presents now for further discussion and recommendations regarding the duration of anticoagulation.    Her previous laboratory studies from [place] at [facility] revealed on June 24 2018: A complete blood count showed hemoglobin 12.9 hematocrit 38.8 MCV 88.3 MCH 29.4 RDW 14.8 WBC 4.4; platelets 279,000.  November 08, 2016 she presented to the emergency department complaining of acute pain under her right breast radiating to the right flank for at least 7 days. The pain was described as severe and radiating down the right flank worse on deep inspiration and cough.  At the time she had no palpitations or swelling of the lower extremities.  She works at home and her lifestyle is fairly sedentary spending hours in front of a computer and going several days at a time without leaving the house. She was not using oral contraception; she denied any recent long distance travel; or recent surgery or trauma.  Initially a noncontrast CT was performed to rule out renal calculi.  There were bibasilar pneumonic consolidations (right > left) with a small right-sided parapneumonic effusion was identified.  CT thoracic angiography with intravenous contrast was performed on November 08, 2016.  The study was somewhat limited with apparent small volume within the left upper lobe pulmonary embolus involving segmental left lower lobe arteries.  There was bibasilar collapse/consolidation right greater than sign left with a small right pleural effusion.  Those results are detailed below.  On November 09, 2016 a duplex venous Doppler was performed on the left lower extremity.  There was evidence of an acute deep vein thrombosis involving the left popliteal vein in the lower extremity.  On November 11, 2016 she was  discharged in stable condition on apixaban. She continues on apixaban to the present. She has had no bleeding tendency with apixaban. She has had no adverse reaction.  Although she states her family has "blood clots," both her mother and father had strokes syndromes.  Her brother at the age of 43 years has a recent diagnosis of non-Hodgkin lymphoma.  She has no diabetes mellitus, coronary artery disease, or cardiac dysrhythmia.  She denies seizure disorder or stroke syndrome.  She  has no thyroid disease.  There is no peptic ulcer or gastroesophageal reflux disease.  She denies viral hepatitis, inflammatory bowel disease, or diverticulosis.  She has never had a screening colonoscopy.  She has no kidney or liver disease.  There is no rheumatoid or gouty arthritis.  For iron deficiency anemia, she has been on oral iron for the past 8 years with ferrous sulfate.  Over the past 12 months she is gained 10 pounds.  In 2011 she had a gastric bypass.  She reports no visual changes or hearing deficit.  She has no headache, dizziness, lightheadedness, syncope, or near syncopal episodes.  She denies cough, sore throat, orthopnea.  She has no dyspnea either at rest or on exertion.  There is no pain or difficulty in swallowing.  No fever, shaking chills, sweats, or flulike symptoms are evident.  She denies heartburn or indigestion.  She has no nausea, vomiting, diarrhea, or constipation.  She moves her bowels once daily.  There is no urinary frequency, urgency, hematuria, or dysuria.  She denies swelling of her ankles.  She has no numbness or tingling in the fingers or toes.  She has no new arthralgias or myalgias.   Her other comorbid problems include primary hypertension, currently not on medication; elevated BMI; sinus tachyarrhythmia; previous gastric sleeve; herpes simplex type II on prophylaxis; iron deficiency anemia on ferrous sulfate; and prurigo nodularis nodular skin lesions, on  hydroxychloroquine.  Recommendation/Plan: We discussed in detail her prior history, prior laboratory studies, role and rationale for a hypercoagulable evaluation to exclude an underlying hereditary/acquired thrombophilia.  It was emphasized that even though there may be no hereditary factors, it does not necessarily mean that anticoagulation should be discontinued.  This will be the subject of our conversation following her initial work-up.  The results of her laboratory studies were not available at the time of discharge.  They will be discussed at the time of her next visit.  There is no suggestion either on physical examination, prior laboratory studies, or previous CT imaging to suggest an underlying lymphoproliferative disorder.  Her prior complete blood count and comprehensive metabolic panel were normal.  It was emphasized that non-Hodgkin lymphoma is not transmitted genetically.    For the present, she was advised to continue apixaban as previously prescribed.  Barring any unforeseen complications, her neck scheduled doctor visit to discuss those results is on December 02, 2018.  She was advised to call us in the interim should any new or untoward problems arise.  The total time spent discussing her prior history, previous laboratory and imaging studies, role and rationale for hypercoagulable testing, and recommendations was 60 minutes.  At least 50% of that time was spent in face-to-face discussion, counseling, and answering questions. All questions were answered to her satisfaction.   This note was dictated using voice activated technology/software.  Unfortunately, typographical errors are not uncommon, and transcription is subject to mistakes and regrettably misinterpretation.  If necessary, clarification of the above information can be discussed with me at any time.  Thank you Dr. Via for allowing my participation in the care of [place]. I will keep you closely informed as the  results of her preliminary laboratory data become available.  Please do not hesitate to call should any questions arise regarding this initial consultation and discussion.  FOLLOW UP: AS DIRECTED   cc:       Caryn Bee Via MD   Toni Arthurs, MD  Hematology/Oncology [facility] [address] [place], [place] [address] Office: [phone] Main: [phone]

## 2018-11-15 ENCOUNTER — Telehealth: Payer: Self-pay | Admitting: *Deleted

## 2018-11-15 NOTE — Telephone Encounter (Signed)
Spoke with pt and gave pt appt for lab only on Tuesday  11/16/18.  Pt voiced understanding.

## 2018-11-15 NOTE — Telephone Encounter (Signed)
Called pt and left message on voice mail requesting a call back to nurse about needing more labs.

## 2018-11-16 ENCOUNTER — Inpatient Hospital Stay: Payer: BLUE CROSS/BLUE SHIELD

## 2018-11-16 ENCOUNTER — Telehealth: Payer: Self-pay | Admitting: *Deleted

## 2018-11-16 DIAGNOSIS — Z9884 Bariatric surgery status: Secondary | ICD-10-CM | POA: Diagnosis not present

## 2018-11-16 DIAGNOSIS — Z86711 Personal history of pulmonary embolism: Secondary | ICD-10-CM | POA: Diagnosis not present

## 2018-11-16 DIAGNOSIS — Z79899 Other long term (current) drug therapy: Secondary | ICD-10-CM | POA: Diagnosis not present

## 2018-11-16 DIAGNOSIS — D6859 Other primary thrombophilia: Secondary | ICD-10-CM

## 2018-11-16 DIAGNOSIS — I2782 Chronic pulmonary embolism: Secondary | ICD-10-CM

## 2018-11-16 DIAGNOSIS — N644 Mastodynia: Secondary | ICD-10-CM | POA: Diagnosis not present

## 2018-11-16 DIAGNOSIS — D509 Iron deficiency anemia, unspecified: Secondary | ICD-10-CM | POA: Diagnosis not present

## 2018-11-16 DIAGNOSIS — Z86718 Personal history of other venous thrombosis and embolism: Secondary | ICD-10-CM | POA: Diagnosis not present

## 2018-11-16 LAB — CBC WITH DIFFERENTIAL (CANCER CENTER ONLY)
Abs Immature Granulocytes: 0.01 10*3/uL (ref 0.00–0.07)
Basophils Absolute: 0 10*3/uL (ref 0.0–0.1)
Basophils Relative: 0 %
Eosinophils Absolute: 0.1 10*3/uL (ref 0.0–0.5)
Eosinophils Relative: 1 %
HCT: 38.5 % (ref 36.0–46.0)
Hemoglobin: 12.5 g/dL (ref 12.0–15.0)
Immature Granulocytes: 0 %
Lymphocytes Relative: 27 %
Lymphs Abs: 1.6 10*3/uL (ref 0.7–4.0)
MCH: 29.5 pg (ref 26.0–34.0)
MCHC: 32.5 g/dL (ref 30.0–36.0)
MCV: 90.8 fL (ref 80.0–100.0)
Monocytes Absolute: 0.4 10*3/uL (ref 0.1–1.0)
Monocytes Relative: 7 %
Neutro Abs: 3.8 10*3/uL (ref 1.7–7.7)
Neutrophils Relative %: 65 %
Platelet Count: 273 10*3/uL (ref 150–400)
RBC: 4.24 MIL/uL (ref 3.87–5.11)
RDW: 13.2 % (ref 11.5–15.5)
WBC Count: 5.9 10*3/uL (ref 4.0–10.5)
nRBC: 0 % (ref 0.0–0.2)

## 2018-11-16 LAB — PLATELET BY CITRATE

## 2018-11-16 LAB — PROTHROMBIN GENE MUTATION

## 2018-11-16 LAB — FACTOR 5 LEIDEN

## 2018-11-16 LAB — D-DIMER, QUANTITATIVE: D-Dimer, Quant: 0.98 ug/mL-FEU — ABNORMAL HIGH (ref 0.00–0.50)

## 2018-11-16 NOTE — Telephone Encounter (Signed)
Faxed office notes from Dr. Caron Presumeuben to Dr. Caryn BeeKevin Via as per md's instructions.

## 2018-11-17 LAB — FACTOR 8 ASSAY: Coagulation Factor VIII: 211 % — ABNORMAL HIGH (ref 56–140)

## 2018-12-02 ENCOUNTER — Encounter: Payer: Self-pay | Admitting: Hematology and Oncology

## 2018-12-02 ENCOUNTER — Telehealth: Payer: Self-pay

## 2018-12-02 ENCOUNTER — Inpatient Hospital Stay: Payer: BLUE CROSS/BLUE SHIELD | Attending: Hematology and Oncology | Admitting: Hematology and Oncology

## 2018-12-02 VITALS — BP 129/88 | HR 80 | Temp 98.0°F | Resp 17

## 2018-12-02 DIAGNOSIS — Z7901 Long term (current) use of anticoagulants: Secondary | ICD-10-CM | POA: Diagnosis not present

## 2018-12-02 DIAGNOSIS — F419 Anxiety disorder, unspecified: Secondary | ICD-10-CM | POA: Diagnosis not present

## 2018-12-02 DIAGNOSIS — I10 Essential (primary) hypertension: Secondary | ICD-10-CM | POA: Diagnosis not present

## 2018-12-02 DIAGNOSIS — B009 Herpesviral infection, unspecified: Secondary | ICD-10-CM

## 2018-12-02 DIAGNOSIS — D6859 Other primary thrombophilia: Secondary | ICD-10-CM | POA: Insufficient documentation

## 2018-12-02 DIAGNOSIS — Z86718 Personal history of other venous thrombosis and embolism: Secondary | ICD-10-CM | POA: Insufficient documentation

## 2018-12-02 DIAGNOSIS — Z86711 Personal history of pulmonary embolism: Secondary | ICD-10-CM

## 2018-12-02 DIAGNOSIS — Z9884 Bariatric surgery status: Secondary | ICD-10-CM | POA: Diagnosis not present

## 2018-12-02 DIAGNOSIS — D509 Iron deficiency anemia, unspecified: Secondary | ICD-10-CM | POA: Insufficient documentation

## 2018-12-02 DIAGNOSIS — L281 Prurigo nodularis: Secondary | ICD-10-CM | POA: Diagnosis not present

## 2018-12-02 DIAGNOSIS — I2782 Chronic pulmonary embolism: Secondary | ICD-10-CM

## 2018-12-02 NOTE — Patient Instructions (Addendum)
We discussed in detail the results of your hypercoagulable evaluation.  As mentioned earlier, protein S, protein C, and ATIII could not be done because you are on anticoagulation with Eliquis.  Copies were given for your review.  All of the gene mutation testing was normal.  Your factor VIII level was 211.  Your anticardiolipin antibody profile was mildly elevated.  These test can vary over time.  For that reason, they should be repeated in 12 weeks.  For the present stay on the anticoagulation with Eliquis.  Barring any unforeseen complications, your next scheduled lab work for repeat factor VIII, anticardiolipin antibody, and d-dimer is on February 08, 2019.  You have a follow-up appointment on February 18 to discuss those results.  Please do not hesitate to call in the interim should any new or untoward problems arise.  Wishing you the happiest and healthiest of holiday seasons and especially the [place]! Merry Christmas!  Debbe Odeaichard Tacuma Graffam, MD Hematology/Oncology

## 2018-12-02 NOTE — Progress Notes (Signed)
Hematology/Oncology OutpatientProgress Note  Patient Name:  Jenna Davenport  DOB: 1971-09-02   Date of Service: December 02, 2018  Referring Provider: Iva Boop, MD [address] [place], [place] [address]   Consulting Physician: Toni Arthurs, MD Hematology/Oncology  Reason for Referral: In the setting of prior bilateral pulmonary emboli and left lower extremity deep vein thrombosis on November 11, 2016; presumably with a family history of "blood clots;" currently on apixaban, she presents now for further discussion and recommendations regarding the duration of anticoagulation and the presence of any hypercoagulable defects.    History Present Illness: Jenna Davenport is a 47 year old resident of [place] whose past medical history is significant for primary hypertension not on medication; elevated BMI; sinus tachyarrhythmia; previous gastric sleeve; herpes simplex type II on prophylaxis; iron deficiency anemia on ferrous sulfate; and prurigo nodularis nodular skin lesions, on hydroxychloroquine. Her primary care provider is Dr. Caryn Bee Via.  She is alone at this visit.  Her prior laboratory studies from [place] [facility] revealed on June 24 2018: A complete blood count showed hemoglobin 12.9 hematocrit 38.8 MCV 88.3 MCH 29.4 RDW 14.8 WBC 4.4; platelets 279,000.  November 08, 2016 she presented to the emergency department complaining of acute pain under her right breast radiating to the right flank for at least 7 days.  The pain was described as severe and radiating down the right flank worse on deep inspiration and cough.  At the time she had no palpitations or swelling of the lower extremities.  She works at home and her lifestyle is fairly sedentary spending hours in front of a computer and going several days at a time without leaving the house. She was not using oral contraception; she denied any recent long distance travel, surgery or trauma.  Initially a noncontrast CT was  performed to rule out renal calculi.  There were bibasilar pneumonic consolidations (right > left) with a small right-sided parapneumonic effusion was identified.  CT thoracic angiography with intravenous contrast was performed on November 08, 2016.  The study was somewhat limited with apparent small volume within the left upper lobe pulmonary embolus involving segmental left lower lobe arteries.  There was bibasilar collapse/consolidation right greater than sign left with a small right pleural effusion.  Those results are detailed below.  On November 09, 2016 a duplex venous Doppler was performed on the left lower extremity.  There was evidence of an acute deep vein thrombosis involving the left popliteal vein in the lower extremity.  On November 11, 2016 she was discharged in stable condition on apixaban. She continues on apixaban to the present. She has had no bleeding tendency with apixaban.  She has no postphlebitic syndrome. She has had no adverse reaction. Although she states her family has "blood clots," both her mother and father had cerebrovascular disease or stroke syndromes.  Her brother at the age of 65 years has a recent diagnosis of non-Hodgkin lymphoma.  She has no diabetes mellitus, coronary artery disease, or cardiac dysrhythmia. She has never had a screening colonoscopy.  She has no kidney or liver disease.  For iron deficiency anemia, she has been on oral iron for the past 8 years with ferrous sulfate.  It is with this background she presents now for further diagnostic and therapeutic recommendations regarding the duration of anticoagulation in the setting of a prior pulmonary emboli and left lower extremity deep vein thrombosis as outlined above.  Interval History: In the interim since her last visit, she reports no new  problems or complaints. Both her appetite and weight remain stable. Over the past 12 months she is gained 10 pounds. In 2011 she had a gastric bypass. There is no epistaxis  or hemoptysis. She has no unusual headache, dizziness, lightheadedness, syncope, near syncopal episodes. She denies rash or itching.  She reports no visual changes or hearing deficit.  She has no chest or abdominal pain.  She denies cough, sore throat, orthopnea.  She has no dyspnea either at rest or on exertion.  There is no pain or difficulty in swallowing.  No fever, shaking chills, sweats, or flulike symptoms are evident.  She denies heartburn or indigestion.  She has no nausea, vomiting, diarrhea, or constipation.  She reports no melena or bright red blood per rectum.  She moves her bowels once daily. There is no urinary frequency, urgency, hematuria, or dysuria.  She denies swelling of her ankles.  She has no numbness or tingling in the fingers or toes. She has no new arthralgias or myalgias.   Past Medical History:  Diagnosis Date  . Anemia   . Pulmonary embolism [facility])    Past Surgical History:  Procedure Laterality Date  . CHOLECYSTECTOMY     2007  . GASTRIC BYPASS     2011  . TUBAL LIGATION     2008   Allergies  Allergen Reactions  . Pollen Extract Other (See Comments)    Itchy, watery eyes   She has no known medical allergies No food allergies  Current Outpatient Medications on File Prior to Visit  Medication Sig  . Multiple Vitamins-Minerals (HAIR SKIN AND NAILS FORMULA) TABS Take 2 tablets by mouth daily.  . valACYclovir (VALTREX) 1000 MG tablet Take 1,000 mg by mouth daily.  Marland Kitchen acetaminophen-codeine (TYLENOL #3) 300-30 MG tablet   . acyclovir (ZOVIRAX) 400 MG tablet   . apixaban (ELIQUIS) 5 MG TABS tablet Take 2 tabs po BID thru 11/16/16 then take 1 tab po BID starting 11/17/16. (Patient taking differently: Take 5 mg by mouth daily. )  . ferrous sulfate 325 (65 FE) MG tablet Take 1 tablet (325 mg total) by mouth daily with breakfast.  . Multiple Vitamins-Minerals (MULTIVITAMIN GUMMIES ADULT PO) Take 2 tablets by mouth daily.    No current facility-administered medications  on file prior to visit.     Review of Systems: Constitutional: No fever, sweats, or shaking chills. No appetite or weight deficit. Skin: No scaling, sores, lumps, or jaundice; prurigo nodularis on hydroxychloroquine. HEENT: No visual changes or hearing deficit; seasonal allergies. Pulmonary: No unusual cough, sore throat, or orthopnea. Breasts: No complaints. Cardiovascular: No coronary artery disease, angina, or myocardial infarction.  No cardiac dysrhythmia, essential hypertension, not on medication; no dyslipidemia. Gastrointestinal: No indigestion, dysphagia, abdominal pain, diarrhea, or constipation.  No change in bowel habits. No melena or bright red blood per rectum.  No nausea or vomiting. Gastric sleeve 2011. Genitourinary: No urinary frequency, urgency, hematuria, or dysuria; herpes simplex type II prophylaxis. Musculoskeletal: No arthralgias or myalgias; no joint swelling, pain, or instability. Hematologic: No bleeding tendency or easy bruisability; chronic iron deficiency without anemia on ferrous sulfate. Endocrine: No intolerance to hot or cold; no thyroid disease or diabetes mellitus. Vascular: No peripheral arterial disease; acute DVT and PE in November 2017. Psychological: No anxiety, depression, or mood changes; no mental health illnesses. Neurological: No headache, dizziness, lightheadedness, syncope, or near syncopal episodes; no numbness or tingling in the fingers or toes.  Physical Examination: Vital Signs: There is no height or weight  on file to calculate BSA.  Vitals:   12/02/18 1340  BP: 129/88  Pulse: 80  Resp: 17  Temp: 98 F (36.7 C)  SpO2: 100%  ECOG PERFORMANCE STATUS: 0 Constitutional:  Jenna Davenport is a fully nourished and developed African-American albeit overweight.  She looks age appropriate. She is friendly and cooperative without respiratory compromise at rest. Skin: No rashes, scaling, dryness, jaundice, or itching. HEENT: Head is normocephalic  and atraumatic. Pupils are equal round and reactive to light and accommodation.  Sclerae are anicteric.  Conjunctivae are pink.  No sinus tenderness nor oropharyngeal lesions.  Lips without cracking or peeling; tongue without mass, inflammation, or nodularity.  Mucous membranes are moist. Neck: Supple and symmetric.  No jugular venous distention or thyromegaly.  Trachea is midline. Lymphatics: No cervical or supraclavicular lymphadenopathy.  No epitrochlear, axillary, or inguinal lymphadenopathy is appreciated. Respiratory/chest: Thorax is symmetrical.  Breath sounds are clear to auscultation and percussion.  Normal excursion and respiratory effort. Back: Symmetric without deformity or tenderness. Cardiovascular: Heart rate and rhythm are regular without murmurs, gallops, or rubs. Gastrointestinal: Abdomen is soft, nontender; no organomegaly.  Bowel sounds are normoactive.  No masses are appreciated. Extremities: In the lower extremities, there is no asymmetric swelling, erythema, tenderness, or cord formation.  No clubbing, cyanosis, nor edema. Hematologic: No petechiae, hematomas, or ecchymoses. Psychological:  She is oriented to person, place, and time; normal affect, memory, and cognition. Neurological: There are no gross neurologic deficits.  Laboratory Results: I have reviewed the data as listed: November 11, 2018  Ref Range & Units 2wk ago (11/16/18) 935yr ago (11/11/16) 10235yr ago (11/10/16) 4235yr ago (11/08/16)  WBC Count 4.0 - 10.5 K/uL 5.9  3.8Low   4.3  7.5   RBC 3.87 - 5.11 MIL/uL 4.24  3.95  4.37  4.49   Hemoglobin 12.0 - 15.0 g/dL 16.112.5  09.6EAV11.0Low   40.912.4  81.112.8   HCT 36.0 - 46.0 % 38.5  34.2Low   37.8  39.6   MCV 80.0 - 100.0 fL 90.8  86.6 R 86.5 R 88.2 R  MCH 26.0 - 34.0 pg 29.5  27.8  28.4  28.5   MCHC 30.0 - 36.0 g/dL 91.432.5  78.232.2  95.632.8  21.332.3   RDW 11.5 - 15.5 % 13.2  14.2  14.4  14.7   Platelet Count 150 - 400 K/uL 273  245  257  257   nRBC 0.0 - 0.2 % 0.0      Neutrophils  Relative % % 65    74   Neutro Abs 1.7 - 7.7 K/uL 3.8    5.6   Lymphocytes Relative % 27    13   Lymphs Abs 0.7 - 4.0 K/uL 1.6    0.9   Monocytes Relative % 7    12   Monocytes Absolute 0.1 - 1.0 K/uL 0.4    0.9   Eosinophils Relative % 1    1   Eosinophils Absolute 0.0 - 0.5 K/uL 0.1    0.0 R  Basophils Relative % 0    0   Basophils Absolute 0.0 - 0.1 K/uL 0.0    0.0   Immature Granulocytes % 0      Abs Immature Granulocytes 0.00 - 0.07 K/uL 0.01       Prothrombin point mutation ([id number]): No mutation identified No lupus anticoagulant identified Factor V Leiden gene mutation: No mutation identified  Ref Range & Units 3wk ago  Anticardiolipin IgG 0 - 14 GPL U/mL <9  Comment: (NOTE)              Negative:       <15              Indeterminate:   15 - 20              Low-Med Positive: >20 - 80              High Positive:     >80   Anticardiolipin IgM 0 - 12 MPL U/mL 16High    Comment: (NOTE)              Negative:       <13              Indeterminate:   13 - 20              Low-Med Positive: >20 - 80              High Positive:     >80   Anticardiolipin IgA 0 - 11 APL U/mL <9   Comment: (NOTE)              Negative:       <12              Indeterminate:   12 - 20              Low-Med Positive: >20 - 80              High Positive:     >80     Ref Range & Units 3wk ago  Beta-2 Glyco I IgG 0 - 20 GPI IgG units <9     Beta-2-Glycoprotein I IgM 0 - 32 GPI IgM units <9     Beta-2-Glycoprotein I IgA 0 - 25 GPI IgA units <9     Serum homocysteine 10.3 D-dimer 0.98 (0-0.50) Factor VIII assay 211 (56-140%)  On June 24 2018: A complete blood count showed hemoglobin 12.9 hematocrit 38.8 MCV 88.3 MCH 29.4 RDW 14.8 WBC 4.4; platelets 279,000.    CMP Latest Ref Rng & Units 11/10/2016  11/09/2016 11/08/2016  Glucose 65 - 99 mg/dL 92 096(E) 454(U)  BUN 6 - 20 mg/dL 6 7 8   Creatinine 0.44 - 1.00 mg/dL 9.81 1.91 4.78  Sodium 135 - 145 mmol/L 138 137 134(L)  Potassium 3.5 - 5.1 mmol/L 3.9 4.2 4.2  Chloride 101 - 111 mmol/L 104 105 101  CO2 22 - 32 mmol/L 29 27 27   Calcium 8.9 - 10.3 mg/dL 2.9(F) 8.3(L) 8.7(L)  Total Protein 6.5 - 8.1 g/dL 7.5 - 8.3(H)  Total Bilirubin 0.3 - 1.2 mg/dL 0.4 - 0.6  Alkaline Phos 38 - 126 U/L 99 - 62  AST 15 - 41 U/L 183(H) - 22  ALT 14 - 54 U/L 59(H) - 11(L)   Diagnostic/Imaging Studies: On November 09, 2016 A 2D echocardiogram: Study Conclusions  - Left ventricle: The cavity size was normal. Wall thickness was increased in a pattern of severe LVH. Systolic function was normal. The estimated ejection fraction was in the range of 60% to 65%. Left ventricular diastolic function parameters were normal. - Aortic valve: Valve area (VTI): 3.22 cm^2. Valve area (Vmax): 3.09 cm^2. Valve area (Vmean): 3.23 cm^2. - Left atrium: The atrium was mildly dilated. - The aorta was normal, not dilated, and non-diseased.  November 08, 2016 Left lower extremity duplex venous Doppler: Summary: There is evidence of acute deep vein thrombosis involving the popliteal vein  of the left lower extremity.  There is no evidence of superficial vein thrombosis involving the left lower extremity. There is no evidence of deep or superficial vein thrombosis involving the right lower extremity. There is no evidence of a Baker&'s cyst bilaterally. Other specific details can be found in the table(s) above. Prepared and Electronically Authenticated by  Lemar Livings, MD November 08, 2016  CT ANGIOGRAPHY CHEST WITH CONTRAST  TECHNIQUE: Multidetector CT imaging of the chest was performed using the standard protocol during bolus administration of intravenous contrast. Multiplanar CT image reconstructions and MIPs were obtained to evaluate the  vascular anatomy.  CONTRAST:  Contrast 100 cc Isovue 370  COMPARISON:  CT abdomen pelvis from earlier today.  FINDINGS: Cardiovascular: The heart is enlarged. No pericardial effusion. No thoracic aortic aneurysm. No evidence for thoracic aortic dissection. Assessment of pulmonary arteries is limited by bolus timing and breathing motion, but the patient does appear to have segmental pulmonary embolus to the lingula (see image 99 series 10 and image 117 series 8).  Mediastinum/Nodes: No mediastinal lymphadenopathy. There is no hilar lymphadenopathy. The esophagus has normal imaging features. There is no axillary lymphadenopathy.  Lungs/Pleura: Right lower lobe collapse/ consolidation is associated with small right pleural effusion. Left lower lobe collapse/ consolidation noted without appreciable effusion.  Upper Abdomen: Small cyst noted upper pole left kidney. Patient is status post gastric bypass.  Musculoskeletal: Bone windows reveal no worrisome lytic or sclerotic osseous lesions.  Review of the MIP images confirms the above findings.  IMPRESSION: 1. Limited study with apparent small volume left upper lobe pulmonary embolus to segmental left lower lobe arteries. 2. Bibasilar collapse/consolidation, right greater than left with small right pleural effusion.  Critical Value/emergent results were called by telephone at the time of interpretation on 11/08/2016 at 11:38 pm to Dr. Geoffery Lyons , who verbally acknowledged these results.  Kennith Center M.D. 11/08/2016 23:39  Summary/Assessment: In the setting of prior bilateral pulmonary emboli and left lower extremity deep vein thrombosis on November 11, 2016; presumably with a family history of "blood clots;" currently on apixaban, she presents now for further discussion and recommendations regarding the duration of anticoagulation and the presence of any hypercoagulable defects.    Her prior laboratory studies  from [place] at [facility] revealed on June 24 2018: A complete blood count showed hemoglobin 12.9 hematocrit 38.8 MCV 88.3 MCH 29.4 RDW 14.8 WBC 4.4; platelets 279,000.  November 08, 2016 she presented to the emergency department complaining of acute pain under her right breast radiating to the right flank for at least 7 days.  The pain was described as severe and radiating down the right flank worse on deep inspiration and cough.  At the time she had no palpitations or swelling of the lower extremities.  She works at home and her lifestyle is fairly sedentary spending hours in front of a computer and going several days at a time without leaving the house. She was not using oral contraception; she denied any recent long distance travel, surgery or trauma.  Initially a noncontrast CT was performed to rule out renal calculi.  There were bibasilar pneumonic consolidations (right > left) with a small right-sided parapneumonic effusion was identified.  CT thoracic angiography with intravenous contrast was performed on November 08, 2016.  The study was somewhat limited with apparent small volume within the left upper lobe pulmonary embolus involving segmental left lower lobe arteries.  There was bibasilar collapse/consolidation right greater than sign left with a small right  pleural effusion.  Those results are detailed below.  On November 09, 2016 a duplex venous Doppler was performed on the left lower extremity.  There was evidence of an acute deep vein thrombosis involving the left popliteal vein in the lower extremity.  On November 11, 2016 she was discharged in stable condition on apixaban. She continues on apixaban to the present. She has had no bleeding tendency with apixaban.  She has no postphlebitic syndrome. She has had no adverse reaction. Although she states her family has "blood clots," both her mother and father had cerebrovascular disease or stroke syndromes.  Her brother at the age of 4 years  has a recent diagnosis of non-Hodgkin lymphoma.  She has no diabetes mellitus, coronary artery disease, or cardiac dysrhythmia. She has never had a screening colonoscopy.  She has no kidney or liver disease.  For iron deficiency anemia, she has been on oral iron for the past 8 years with ferrous sulfate.  It is with this background she presents now for further diagnostic and therapeutic recommendations regarding the duration of anticoagulation in the setting of a prior pulmonary emboli and left lower extremity deep vein thrombosis as outlined above.  In the interim since her last visit, she reports no new problems or complaints.  Both her appetite and weight remain stable. Over the past 12 months she is gained 10 pounds. In 2011 she had a gastric bypass.  There is no epistaxis or hemoptysis.  She has no unusual headache, dizziness, lightheadedness, syncope, near syncopal episodes. She denies rash or itching.  She reports no visual changes or hearing deficit.  She has no chest or abdominal pain.  She denies cough, sore throat, orthopnea.  She has no dyspnea either at rest or on exertion.  There is no pain or difficulty in swallowing.  No fever, shaking chills, sweats, or flulike symptoms are evident.  She denies heartburn or indigestion.  She has no nausea, vomiting, diarrhea, or constipation.  She reports no melena or bright red blood per rectum.  She moves her bowels once daily. There is no urinary frequency, urgency, hematuria, or dysuria.  She denies swelling of her ankles.  She has no numbness or tingling in the fingers or toes. She has no arthralgias or myalgias.   Her other comorbid problems include primary hypertension, currently not on medication; elevated BMI; sinus tachyarrhythmia; previous gastric sleeve; herpes simplex type II on prophylaxis; iron deficiency anemia on ferrous sulfate; and prurigo nodularis nodular skin lesions, on hydroxychloroquine.  Recommendation/Plan: At the time of her  initial visit, we discussed in detail her prior history, prior laboratory studies, role and rationale for a hypercoagulable evaluation to exclude an underlying hereditary/acquired thrombophilia.  It was emphasized that even though there may be no hereditary factors, it does not necessarily mean that anticoagulation should be discontinued.    The results of her laboratory studies were discussed in detail.  Those results are outlined above.  Because her factor VIII, anticardiolipin IgM, and d-dimer were elevated, it was recommended that they be repeated in 12 weeks. They are not hereditary factors associated with thrombophilia.  They are however independent risk factors for recurrent venous thromboembolic events. Factor VIII specifically can be an acute phase reactant and elevated in any underlying inflammatory, infectious, or stressful situation.  It was recommended at the time of her next lab draw that abstain from any strenuous exercise, or delay testing if she has any infection or flulike illness.  These circumstances can spuriously elevate the  factor VIII level.  We reassured her again that there is no suggestion either on physical examination, prior laboratory studies, or previous CT imaging to suggest an underlying lymphoproliferative disorder.  Her prior complete blood count and comprehensive metabolic panel were normal.  It was emphasized that non-Hodgkin lymphoma is not transmitted genetically.    For the present, she was advised to continue apixaban as previously prescribed.  Barring any unforeseen complications, her next scheduled lab work is on February 11.  A follow-up visit has been scheduled 1 week later, on February 18, to discuss those results.  She was advised that a new provider will be available to discuss those results since I am leaving the practice.  She was advised to call us in the interim should any new or untoward problems arise.  This note was dictated using voice activated  technology/software. Unfortunately, typographical errors are not uncommon, and transcription is subject to mistakes and regrettably misinterpretation.  If necessary, clarification of the above information can be discussed with me at any time.  FOLLOW UP: AS DIRECTED   cc:       Caryn Bee Via MD  Toni Arthurs, MD  Hematology/Oncology [facility] [address] [place], [place] [address] Office: [phone] Main: [phone]

## 2018-12-02 NOTE — Telephone Encounter (Signed)
Patient asked not to schedule her with any providers at this time. [facility] per 12/5 los

## 2018-12-08 ENCOUNTER — Other Ambulatory Visit: Payer: BLUE CROSS/BLUE SHIELD

## 2018-12-10 DIAGNOSIS — F419 Anxiety disorder, unspecified: Secondary | ICD-10-CM | POA: Diagnosis not present

## 2018-12-15 ENCOUNTER — Ambulatory Visit: Payer: BLUE CROSS/BLUE SHIELD | Admitting: Hematology

## 2019-01-08 DIAGNOSIS — F419 Anxiety disorder, unspecified: Secondary | ICD-10-CM | POA: Diagnosis not present

## 2019-01-18 DIAGNOSIS — F419 Anxiety disorder, unspecified: Secondary | ICD-10-CM | POA: Diagnosis not present

## 2019-02-03 DIAGNOSIS — F419 Anxiety disorder, unspecified: Secondary | ICD-10-CM | POA: Diagnosis not present

## 2019-02-08 ENCOUNTER — Inpatient Hospital Stay: Payer: BLUE CROSS/BLUE SHIELD

## 2019-02-09 ENCOUNTER — Inpatient Hospital Stay: Payer: BLUE CROSS/BLUE SHIELD | Attending: Hematology and Oncology

## 2019-02-09 DIAGNOSIS — Z79899 Other long term (current) drug therapy: Secondary | ICD-10-CM | POA: Diagnosis not present

## 2019-02-09 DIAGNOSIS — Z7901 Long term (current) use of anticoagulants: Secondary | ICD-10-CM | POA: Insufficient documentation

## 2019-02-09 DIAGNOSIS — Z86718 Personal history of other venous thrombosis and embolism: Secondary | ICD-10-CM | POA: Insufficient documentation

## 2019-02-09 DIAGNOSIS — Z86711 Personal history of pulmonary embolism: Secondary | ICD-10-CM | POA: Insufficient documentation

## 2019-02-09 DIAGNOSIS — D6859 Other primary thrombophilia: Secondary | ICD-10-CM | POA: Diagnosis not present

## 2019-02-09 DIAGNOSIS — I2782 Chronic pulmonary embolism: Secondary | ICD-10-CM

## 2019-02-09 LAB — D-DIMER, QUANTITATIVE: D-Dimer, Quant: 0.39 ug/mL-FEU (ref 0.00–0.50)

## 2019-02-10 LAB — CARDIOLIPIN ANTIBODIES, IGG, IGM, IGA
Anticardiolipin IgA: <9 APL U/mL (ref 0–11)
Anticardiolipin IgG: <9 GPL U/mL (ref 0–14)
Anticardiolipin IgM: 10 MPL U/mL (ref 0–12)

## 2019-02-10 LAB — FACTOR 8 ASSAY: Coagulation Factor VIII: 127 % (ref 56–140)

## 2019-02-13 ENCOUNTER — Other Ambulatory Visit: Payer: Self-pay | Admitting: Hematology

## 2019-02-13 DIAGNOSIS — I2609 Other pulmonary embolism with acute cor pulmonale: Secondary | ICD-10-CM

## 2019-02-14 ENCOUNTER — Telehealth: Payer: Self-pay | Admitting: Hematology

## 2019-02-14 NOTE — Telephone Encounter (Signed)
R/s appt per 2/17 sch message- pt is aware of appt date and time

## 2019-02-16 ENCOUNTER — Inpatient Hospital Stay: Payer: BLUE CROSS/BLUE SHIELD | Admitting: Hematology

## 2019-02-17 DIAGNOSIS — F419 Anxiety disorder, unspecified: Secondary | ICD-10-CM | POA: Diagnosis not present

## 2019-02-22 DIAGNOSIS — Z1231 Encounter for screening mammogram for malignant neoplasm of breast: Secondary | ICD-10-CM | POA: Diagnosis not present

## 2019-02-24 ENCOUNTER — Inpatient Hospital Stay (HOSPITAL_BASED_OUTPATIENT_CLINIC_OR_DEPARTMENT_OTHER): Payer: BLUE CROSS/BLUE SHIELD | Admitting: Hematology

## 2019-02-24 ENCOUNTER — Inpatient Hospital Stay: Payer: BLUE CROSS/BLUE SHIELD

## 2019-02-24 ENCOUNTER — Encounter: Payer: Self-pay | Admitting: Hematology

## 2019-02-24 ENCOUNTER — Other Ambulatory Visit: Payer: Self-pay | Admitting: Hematology

## 2019-02-24 ENCOUNTER — Other Ambulatory Visit: Payer: Self-pay

## 2019-02-24 VITALS — BP 140/71 | HR 72 | Temp 97.7°F | Resp 20 | Wt 256.5 lb

## 2019-02-24 DIAGNOSIS — Z86711 Personal history of pulmonary embolism: Secondary | ICD-10-CM | POA: Diagnosis not present

## 2019-02-24 DIAGNOSIS — I2699 Other pulmonary embolism without acute cor pulmonale: Secondary | ICD-10-CM

## 2019-02-24 DIAGNOSIS — I82432 Acute embolism and thrombosis of left popliteal vein: Secondary | ICD-10-CM

## 2019-02-24 DIAGNOSIS — Z7901 Long term (current) use of anticoagulants: Secondary | ICD-10-CM | POA: Diagnosis not present

## 2019-02-24 DIAGNOSIS — Z86718 Personal history of other venous thrombosis and embolism: Secondary | ICD-10-CM | POA: Diagnosis not present

## 2019-02-24 DIAGNOSIS — Z79899 Other long term (current) drug therapy: Secondary | ICD-10-CM | POA: Diagnosis not present

## 2019-02-24 DIAGNOSIS — I2609 Other pulmonary embolism with acute cor pulmonale: Secondary | ICD-10-CM

## 2019-02-24 DIAGNOSIS — D6859 Other primary thrombophilia: Secondary | ICD-10-CM

## 2019-02-24 LAB — CBC WITH DIFFERENTIAL (CANCER CENTER ONLY)
Abs Immature Granulocytes: 0.01 10*3/uL (ref 0.00–0.07)
Basophils Absolute: 0 10*3/uL (ref 0.0–0.1)
Basophils Relative: 0 %
Eosinophils Absolute: 0.1 10*3/uL (ref 0.0–0.5)
Eosinophils Relative: 1 %
HCT: 38.3 % (ref 36.0–46.0)
Hemoglobin: 12.1 g/dL (ref 12.0–15.0)
Immature Granulocytes: 0 %
Lymphocytes Relative: 26 %
Lymphs Abs: 1.8 10*3/uL (ref 0.7–4.0)
MCH: 28.9 pg (ref 26.0–34.0)
MCHC: 31.6 g/dL (ref 30.0–36.0)
MCV: 91.4 fL (ref 80.0–100.0)
Monocytes Absolute: 0.6 10*3/uL (ref 0.1–1.0)
Monocytes Relative: 8 %
Neutro Abs: 4.6 10*3/uL (ref 1.7–7.7)
Neutrophils Relative %: 65 %
Platelet Count: 273 10*3/uL (ref 150–400)
RBC: 4.19 MIL/uL (ref 3.87–5.11)
RDW: 13.7 % (ref 11.5–15.5)
WBC Count: 7.1 10*3/uL (ref 4.0–10.5)
nRBC: 0 % (ref 0.0–0.2)

## 2019-02-24 LAB — CMP (CANCER CENTER ONLY)
ALT: 12 U/L (ref 0–44)
AST: 22 U/L (ref 15–41)
Albumin: 3.8 g/dL (ref 3.5–5.0)
Alkaline Phosphatase: 74 U/L (ref 38–126)
Anion gap: 4 — ABNORMAL LOW (ref 5–15)
BUN: 14 mg/dL (ref 6–20)
CO2: 28 mmol/L (ref 22–32)
Calcium: 8.7 mg/dL — ABNORMAL LOW (ref 8.9–10.3)
Chloride: 107 mmol/L (ref 98–111)
Creatinine: 0.87 mg/dL (ref 0.44–1.00)
GFR, Est AFR Am: >60 mL/min (ref >60)
GFR, Estimated: >60 mL/min (ref >60)
Glucose, Bld: 89 mg/dL (ref 70–99)
Potassium: 4.5 mmol/L (ref 3.5–5.1)
Sodium: 139 mmol/L (ref 135–145)
Total Bilirubin: 0.3 mg/dL (ref 0.3–1.2)
Total Protein: 7.2 g/dL (ref 6.5–8.1)

## 2019-02-24 MED ORDER — APIXABAN 2.5 MG PO TABS: 2.5000 mg | ORAL_TABLET | Freq: Two times a day (BID) | ORAL | 3 refills | Status: DC

## 2019-02-24 NOTE — Progress Notes (Signed)
[facility] OFFICE PROGRESS NOTE  Patient Care Team: ViaCaryn Bee, MD as PCP - General (Family Medicine)  HEME/ONC OVERVIEW: 1. History of LLE DVT and LUL PTE, unprovoked -Previous patient of Dr. Caron Presume -10/2016: small LUL pulmonary embolus; doppler showed acute DVT involving left popliteal vein; on Eliquis since then -10/2018: hypercoag work-up negative for prothrombin gene mutation, Factor V Leiden and APLS   TREATMENT REGIMEN:  10/2016 - present: Eliquis 5mg  BID x ~2 years; transitioned to 2.5mg  BID for secondary ppx   ASSESSMENT & PLAN:   History of LLE DVT and bilateral PTE, unprovoked -I reviewed the recent testing results, including anticardiolipin antibody, in detail with the patient -In summary, the patient had very mildly elevated anticardiolipin IgM in 10/2018; repeat testing by Dr. Caron Presume showed normal IgM level.  Her other anticoagulant but work-up, including prothrombin gene mutation, factor V Leiden, and the remainder of antiphospholipid syndrome, was all negative -Patient has been on Eliquis 5mg  BID since 10/2016 -Given the unprovoked nature of LLE DVT and LUL PTE, the goal of anticoagulation is lifelong; I do not recommend any additional work-up for hypercoagulable state as it would not change management -As the patient has completed therapeutic dosing of Eliquis for over 2 years, and has not had any recurrent symptoms of DVT or PTE, she is a good candidate for secondary prophylaxis with Eliquis 2.5mg  BID  -The patient expressed understanding and agreed to the plan; she will finish her current Eliquis 5mg  BID and begin the lower dose with the next refill -I recommend the patient to use elastic compression stockings at 20-30 mmHg to reduce risks of chronic thrombophlebitis. -I also reinforced the importance of preventive strategies such as avoiding hormonal supplement, avoiding cigarette smoking, keeping up-to-date with screening programs for early cancer  detection, frequent ambulation for long distance travel and aggressive DVT prophylaxis in all surgical settings. -Should she need any interruption of the anticoagulation for elective procedures in the future, feel free to contact me regarding peri-operative management.   Orders Placed This Encounter  Procedures  . CBC with Differential (Cancer Center Only)    Standing Status:   Future    Standing Expiration Date:   03/30/2020  . CMP (Cancer Center only)    Standing Status:   Future    Standing Expiration Date:   03/30/2020   All questions were answered. The patient knows to call the clinic with any problems, questions or concerns. No barriers to learning was detected.  A total of more than 25 minutes were spent face-to-face with the patient during this encounter and over half of that time was spent on counseling and coordination of care as outlined above.   Arthur Holms, MD 02/24/2019 4:13 PM  CHIEF COMPLAINT: "I am here for my blood clot"  INTERVAL HISTORY: Ms. Rhim returns to clinic for follow-up of history of PTE and DVT.  Patient reports that she has some menorrhagia after she was started on Eliquis in 2018, for which he underwent uterine fibroid ablation by her gynecologist in [place] with resolution of menorrhagia.  She has not had any symptoms of recurrent bleeding since then.  She is tolerating Eliquis without any symptoms of bleeding or bruising.  She is up-to-date with her cancer screening, including mammogram and Pap smears.  She has not had a colonoscopy yet, but will discuss with her PCP regarding colon cancer screening.  She otherwise denies any complaint today.  REVIEW OF SYSTEMS:   Constitutional: ( - ) fevers, ( - )  chills , ( - ) night sweats Eyes: ( - ) blurriness of vision, ( - ) double vision, ( - ) watery eyes Ears, nose, mouth, throat, and face: ( - ) mucositis, ( - ) sore throat Respiratory: ( - ) cough, ( - ) dyspnea, ( - ) wheezes Cardiovascular: ( - )  palpitation, ( - ) chest discomfort, ( - ) lower extremity swelling Gastrointestinal:  ( - ) nausea, ( - ) heartburn, ( - ) change in bowel habits Skin: ( - ) abnormal skin rashes Lymphatics: ( - ) new lymphadenopathy, ( - ) easy bruising Neurological: ( - ) numbness, ( - ) tingling, ( - ) new weaknesses Behavioral/Psych: ( - ) mood change, ( - ) new changes  All other systems were reviewed with the patient and are negative.  I have reviewed the past medical history, past surgical history, social history and family history with the patient and they are unchanged from previous note.  ALLERGIES:  is allergic to pollen extract.  MEDICATIONS:  Current Outpatient Medications  Medication Sig Dispense Refill  . Multiple Vitamins-Minerals (HAIR SKIN AND NAILS FORMULA) TABS Take 2 tablets by mouth daily.    . valACYclovir (VALTREX) 1000 MG tablet Take 1,000 mg by mouth daily.    Marland Kitchen apixaban (ELIQUIS) 2.5 MG TABS tablet Take 1 tablet (2.5 mg total) by mouth 2 (two) times daily. 180 tablet 3   No current facility-administered medications for this visit.     PHYSICAL EXAMINATION: ECOG PERFORMANCE STATUS: 0 - Asymptomatic  Today's Vitals   02/24/19 1539 02/24/19 1544  BP: 140/71   Pulse: 72   Resp: 20   Temp: 97.7 F (36.5 C)   TempSrc: Oral   SpO2: 100%   Weight: 256 lb 8 oz (116.3 kg)   PainSc:  0-No pain   Body mass index is 40.17 kg/m.  Filed Weights   02/24/19 1539  Weight: 256 lb 8 oz (116.3 kg)    GENERAL: alert, no distress and comfortable SKIN: skin color, texture, turgor are normal, no rashes or significant lesions EYES: conjunctiva are pink and non-injected, sclera clear OROPHARYNX: no exudate, no erythema; lips, buccal mucosa, and tongue normal  NECK: supple, non-tender LUNGS: clear to auscultation with normal breathing effort HEART: regular rate & rhythm and no murmurs and no lower extremity edema ABDOMEN: soft, non-tender, non-distended, normal bowel  sounds Musculoskeletal: no cyanosis of digits and no clubbing  PSYCH: alert & oriented x 3, fluent speech NEURO: no focal motor/sensory deficits  LABORATORY DATA:  I have reviewed the data as listed    Component Value Date/Time   NA 139 02/24/2019 1532   K 4.5 02/24/2019 1532   CL 107 02/24/2019 1532   CO2 28 02/24/2019 1532   GLUCOSE 89 02/24/2019 1532   BUN 14 02/24/2019 1532   CREATININE 0.87 02/24/2019 1532   CALCIUM 8.7 (L) 02/24/2019 1532   PROT 7.2 02/24/2019 1532   ALBUMIN 3.8 02/24/2019 1532   AST 22 02/24/2019 1532   ALT 12 02/24/2019 1532   ALKPHOS 74 02/24/2019 1532   BILITOT 0.3 02/24/2019 1532   GFRNONAA >60 02/24/2019 1532   GFRAA >60 02/24/2019 1532    No results found for: SPEP, UPEP  Lab Results  Component Value Date   WBC 7.1 02/24/2019   NEUTROABS 4.6 02/24/2019   HGB 12.1 02/24/2019   HCT 38.3 02/24/2019   MCV 91.4 02/24/2019   PLT 273 02/24/2019      Chemistry  Component Value Date/Time   NA 139 02/24/2019 1532   K 4.5 02/24/2019 1532   CL 107 02/24/2019 1532   CO2 28 02/24/2019 1532   BUN 14 02/24/2019 1532   CREATININE 0.87 02/24/2019 1532      Component Value Date/Time   CALCIUM 8.7 (L) 02/24/2019 1532   ALKPHOS 74 02/24/2019 1532   AST 22 02/24/2019 1532   ALT 12 02/24/2019 1532   BILITOT 0.3 02/24/2019 1532

## 2019-03-15 DIAGNOSIS — F419 Anxiety disorder, unspecified: Secondary | ICD-10-CM | POA: Diagnosis not present

## 2019-03-29 DIAGNOSIS — F419 Anxiety disorder, unspecified: Secondary | ICD-10-CM | POA: Diagnosis not present

## 2019-04-06 ENCOUNTER — Other Ambulatory Visit: Payer: Self-pay | Admitting: *Deleted

## 2019-04-06 ENCOUNTER — Telehealth: Payer: Self-pay | Admitting: *Deleted

## 2019-04-06 DIAGNOSIS — I2699 Other pulmonary embolism without acute cor pulmonale: Secondary | ICD-10-CM

## 2019-04-06 DIAGNOSIS — I82432 Acute embolism and thrombosis of left popliteal vein: Secondary | ICD-10-CM

## 2019-04-06 MED ORDER — APIXABAN 2.5 MG PO TABS: 2.5000 mg | ORAL_TABLET | Freq: Two times a day (BID) | ORAL | 3 refills | Status: DC

## 2019-04-06 NOTE — Telephone Encounter (Signed)
Call received from patient requesting [facility]. [facility] per pt request.

## 2019-04-18 DIAGNOSIS — F419 Anxiety disorder, unspecified: Secondary | ICD-10-CM | POA: Diagnosis not present

## 2019-04-28 DIAGNOSIS — F419 Anxiety disorder, unspecified: Secondary | ICD-10-CM | POA: Diagnosis not present

## 2019-05-09 DIAGNOSIS — F419 Anxiety disorder, unspecified: Secondary | ICD-10-CM | POA: Diagnosis not present

## 2019-05-26 DIAGNOSIS — F419 Anxiety disorder, unspecified: Secondary | ICD-10-CM | POA: Diagnosis not present

## 2019-05-27 ENCOUNTER — Other Ambulatory Visit: Payer: BLUE CROSS/BLUE SHIELD

## 2019-05-27 ENCOUNTER — Ambulatory Visit: Payer: BLUE CROSS/BLUE SHIELD | Admitting: Hematology

## 2019-06-01 DIAGNOSIS — L810 Postinflammatory hyperpigmentation: Secondary | ICD-10-CM | POA: Insufficient documentation

## 2019-06-01 DIAGNOSIS — L281 Prurigo nodularis: Secondary | ICD-10-CM | POA: Diagnosis not present

## 2019-06-02 ENCOUNTER — Inpatient Hospital Stay (HOSPITAL_BASED_OUTPATIENT_CLINIC_OR_DEPARTMENT_OTHER): Payer: BC Managed Care – PPO | Admitting: Hematology

## 2019-06-02 ENCOUNTER — Inpatient Hospital Stay: Payer: BC Managed Care – PPO | Attending: Hematology

## 2019-06-02 ENCOUNTER — Other Ambulatory Visit: Payer: Self-pay

## 2019-06-02 ENCOUNTER — Encounter: Payer: Self-pay | Admitting: Hematology

## 2019-06-02 VITALS — BP 133/81 | HR 92 | Temp 98.0°F | Resp 18 | Ht 67.0 in | Wt 250.8 lb

## 2019-06-02 DIAGNOSIS — Z86718 Personal history of other venous thrombosis and embolism: Secondary | ICD-10-CM | POA: Diagnosis not present

## 2019-06-02 DIAGNOSIS — Z7901 Long term (current) use of anticoagulants: Secondary | ICD-10-CM

## 2019-06-02 DIAGNOSIS — Z719 Counseling, unspecified: Secondary | ICD-10-CM

## 2019-06-02 DIAGNOSIS — Z86711 Personal history of pulmonary embolism: Secondary | ICD-10-CM | POA: Insufficient documentation

## 2019-06-02 DIAGNOSIS — I82432 Acute embolism and thrombosis of left popliteal vein: Secondary | ICD-10-CM

## 2019-06-02 DIAGNOSIS — I2699 Other pulmonary embolism without acute cor pulmonale: Secondary | ICD-10-CM

## 2019-06-02 LAB — CBC WITH DIFFERENTIAL (CANCER CENTER ONLY)
Abs Immature Granulocytes: 0.01 10*3/uL (ref 0.00–0.07)
Basophils Absolute: 0 10*3/uL (ref 0.0–0.1)
Basophils Relative: 0 %
Eosinophils Absolute: 0.1 10*3/uL (ref 0.0–0.5)
Eosinophils Relative: 1 %
HCT: 39.6 % (ref 36.0–46.0)
Hemoglobin: 12.7 g/dL (ref 12.0–15.0)
Immature Granulocytes: 0 %
Lymphocytes Relative: 32 %
Lymphs Abs: 2 10*3/uL (ref 0.7–4.0)
MCH: 28.5 pg (ref 26.0–34.0)
MCHC: 32.1 g/dL (ref 30.0–36.0)
MCV: 89 fL (ref 80.0–100.0)
Monocytes Absolute: 0.4 10*3/uL (ref 0.1–1.0)
Monocytes Relative: 6 %
Neutro Abs: 3.8 10*3/uL (ref 1.7–7.7)
Neutrophils Relative %: 61 %
Platelet Count: 294 10*3/uL (ref 150–400)
RBC: 4.45 MIL/uL (ref 3.87–5.11)
RDW: 14.5 % (ref 11.5–15.5)
WBC Count: 6.2 10*3/uL (ref 4.0–10.5)
nRBC: 0 % (ref 0.0–0.2)

## 2019-06-02 LAB — CMP (CANCER CENTER ONLY)
ALT: 12 U/L (ref 0–44)
AST: 24 U/L (ref 15–41)
Albumin: 4 g/dL (ref 3.5–5.0)
Alkaline Phosphatase: 74 U/L (ref 38–126)
Anion gap: 8 (ref 5–15)
BUN: 11 mg/dL (ref 6–20)
CO2: 24 mmol/L (ref 22–32)
Calcium: 8.6 mg/dL — ABNORMAL LOW (ref 8.9–10.3)
Chloride: 104 mmol/L (ref 98–111)
Creatinine: 0.99 mg/dL (ref 0.44–1.00)
GFR, Est AFR Am: >60 mL/min (ref >60)
GFR, Estimated: >60 mL/min (ref >60)
Glucose, Bld: 98 mg/dL (ref 70–99)
Potassium: 4.1 mmol/L (ref 3.5–5.1)
Sodium: 136 mmol/L (ref 135–145)
Total Bilirubin: 0.3 mg/dL (ref 0.3–1.2)
Total Protein: 7.3 g/dL (ref 6.5–8.1)

## 2019-06-02 NOTE — Progress Notes (Signed)
[facility] OFFICE PROGRESS NOTE  Patient Care Team: ViaCaryn Bee, MD as PCP - General (Family Medicine)  HEME/ONC OVERVIEW: 1. History of LLE DVT and LUL PTE, unprovoked -Previous patient of Dr. Caron Presume -10/2016: small LUL pulmonary embolus; doppler showed acute DVT involving left popliteal vein; on Eliquis since then -10/2018: hypercoag work-up negative for prothrombin gene mutation, Factor V Leiden and APLS  TREATMENT REGIMEN:  10/2016 - 01/2019: Eliquis  BID  01/2019 - present: Eliquis 2.5mg  BID for secondary ppx  ASSESSMENT & PLAN:   History of LLE DVT and bilateral PTE, unprovoked -Completed at least 2 years of therapeutic Eliquis  BID; dose reduced to 2.5mg  BID for secondary ppx in 01/2019 -Patient is tolerating the dose well without any abnormal bleeding or bruising -Clinically, she denies any symptoms of recurrent DVT or PTE  -Continue Eliquis 2.5mg  BID for secondary ppx -We discussed that there is no indication for routine imaging unless she develops any clinically suspicious symptoms -I reinforced the importance of preventive strategies such as avoiding hormonal supplement, avoiding cigarette smoking, keeping up-to-date with screening programs for early cancer detection, frequent ambulation for long distance travel and aggressive DVT prophylaxis in all surgical settings. -Should she need any interruption of the anticoagulation for elective procedures in the future, feel free to contact me regarding peri-operative management  Health counseling -I spent some time counseling the patient on importance of age-appropriate cancer screening, including colonoscopy, mammogram and Pap smears -As the patient will turn age 48 in 06/2019, I will refer the patient to gastroenterology to discuss screening colonoscopy -If colonoscopy requires interruption of anticoagulation, okay to hold Eliquis 2 days prior to procedure and resume Eliquis 1 day afterwards  Orders Placed  This Encounter  Procedures  . CBC with Differential (Cancer Center Only)    Standing Status:   Future    Standing Expiration Date:   07/06/2020  . CMP (Cancer Center only)    Standing Status:   Future    Standing Expiration Date:   07/06/2020  . Ambulatory referral to Gastroenterology    Referral Priority:   Routine    Referral Type:   Consultation    Referral Reason:   Specialty Services Required    Referred to Provider:   Shellia Cleverly, DO    Number of Visits Requested:   1   All questions were answered. The patient knows to call the clinic with any problems, questions or concerns. No barriers to learning was detected.  A total of more than 15 minutes were spent face-to-face with the patient during this encounter and over half of that time was spent on counseling and coordination of care as outlined above.   Return in 6 months for labs and clinic follow-up.   Arthur Holms, MD 06/02/2019 3:50 PM  CHIEF COMPLAINT: "I am feeling pretty good"  INTERVAL HISTORY: Ms. Mclin returns to clinic for follow-up of history of DVT and PTE.  Patient reports that since reducing her Eliquis dose to 2.5 mg twice daily, she has not noticed any interval change, such as abnormal bleeding or bruising.  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  She has been trying to do regular exercises since working from home.  She denies any other complaint today.  REVIEW OF SYSTEMS:   Constitutional: ( - ) fevers, ( - )  chills , ( - ) night sweats Eyes: ( - ) blurriness of vision, ( - )  double vision, ( - ) watery eyes Ears, nose, mouth, throat, and face: ( - ) mucositis, ( - ) sore throat Respiratory: ( - ) cough, ( - ) dyspnea, ( - ) wheezes Cardiovascular: ( - ) palpitation, ( - ) chest discomfort, ( - ) lower extremity swelling Gastrointestinal:  ( - ) nausea, ( - ) heartburn, ( - ) change in bowel habits Skin: ( - ) abnormal skin rashes Lymphatics:  ( - ) new lymphadenopathy, ( - ) easy bruising Neurological: ( - ) numbness, ( - ) tingling, ( - ) new weaknesses Behavioral/Psych: ( - ) mood change, ( - ) new changes  All other systems were reviewed with the patient and are negative.  I have reviewed the past medical history, past surgical history, social history and family history with the patient and they are unchanged from previous note.  ALLERGIES:  is allergic to pollen extract.  MEDICATIONS:  Current Outpatient Medications  Medication Sig Dispense Refill  . apixaban (ELIQUIS) 2.5 MG TABS tablet Take 1 tablet (2.5 mg total) by mouth 2 (two) times daily. 180 tablet 3  . Multiple Vitamins-Minerals (HAIR SKIN AND NAILS FORMULA) TABS Take 2 tablets by mouth daily.    . valACYclovir (VALTREX) 1000 MG tablet Take 1,000 mg by mouth daily.     No current facility-administered medications for this visit.     PHYSICAL EXAMINATION: ECOG PERFORMANCE STATUS: 0 - Asymptomatic  Today's Vitals   06/02/19 1547  BP: 133/81  Pulse: 92  Resp: 18  Temp: 98 F (36.7 C)  TempSrc: Oral  SpO2: 100%  Weight: 250 lb 12.8 oz (113.8 kg)  Height: 5\' 7"  (1.702 m)  PainSc: 0-No pain   Body mass index is 39.28 kg/m.  Filed Weights   06/02/19 1547  Weight: 250 lb 12.8 oz (113.8 kg)    GENERAL: alert, no distress and comfortable SKIN: skin color, texture, turgor are normal, no rashes or significant lesions EYES: conjunctiva are pink and non-injected, sclera clear OROPHARYNX: no exudate, no erythema; lips, buccal mucosa, and tongue normal  NECK: supple, non-tender LUNGS: clear to auscultation with normal breathing effort HEART: regular rate & rhythm and no murmurs and no lower extremity edema ABDOMEN: soft, non-tender, non-distended, normal bowel sounds Musculoskeletal: no cyanosis of digits and no clubbing  PSYCH: alert & oriented x 3, fluent speech NEURO: no focal motor/sensory deficits  LABORATORY DATA:  I have reviewed the data as  listed    Component Value Date/Time   NA 136 06/02/2019 1518   K 4.1 06/02/2019 1518   CL 104 06/02/2019 1518   CO2 24 06/02/2019 1518   GLUCOSE 98 06/02/2019 1518   BUN 11 06/02/2019 1518   CREATININE 0.99 06/02/2019 1518   CALCIUM 8.6 (L) 06/02/2019 1518   PROT 7.3 06/02/2019 1518   ALBUMIN 4.0 06/02/2019 1518   AST 24 06/02/2019 1518   ALT 12 06/02/2019 1518   ALKPHOS 74 06/02/2019 1518   BILITOT 0.3 06/02/2019 1518   GFRNONAA >60 06/02/2019 1518   GFRAA >60 06/02/2019 1518    No results found for: SPEP, UPEP  Lab Results  Component Value Date   WBC 6.2 06/02/2019   NEUTROABS 3.8 06/02/2019   HGB 12.7 06/02/2019   HCT 39.6 06/02/2019   MCV 89.0 06/02/2019   PLT 294 06/02/2019      Chemistry      Component Value Date/Time   NA 136 06/02/2019 1518   K 4.1 06/02/2019 1518   CL 104  06/02/2019 1518   CO2 24 06/02/2019 1518   BUN 11 06/02/2019 1518   CREATININE 0.99 06/02/2019 1518      Component Value Date/Time   CALCIUM 8.6 (L) 06/02/2019 1518   ALKPHOS 74 06/02/2019 1518   AST 24 06/02/2019 1518   ALT 12 06/02/2019 1518   BILITOT 0.3 06/02/2019 1518

## 2019-06-03 ENCOUNTER — Telehealth: Payer: Self-pay | Admitting: *Deleted

## 2019-06-03 NOTE — Telephone Encounter (Signed)
As noted below by Dr. Dion Body, I informed patient of lab results. She verbalized understanding.

## 2019-06-03 NOTE — Telephone Encounter (Signed)
-----   Message from Arthur Holms, MD sent at 06/02/2019  3:53 PM EDT ----- Can we let Ms. Hereford know her labs were all normal today?Thanks.  GZ ----- Message ----- From: Leory Plowman, Lab In [place] Sent: 06/02/2019   3:25 PM EDT To: Arthur Holms, MD

## 2019-06-15 DIAGNOSIS — F419 Anxiety disorder, unspecified: Secondary | ICD-10-CM | POA: Diagnosis not present

## 2019-07-12 ENCOUNTER — Telehealth (INDEPENDENT_AMBULATORY_CARE_PROVIDER_SITE_OTHER): Payer: BC Managed Care – PPO | Admitting: Gastroenterology

## 2019-07-12 ENCOUNTER — Other Ambulatory Visit: Payer: Self-pay

## 2019-07-12 ENCOUNTER — Encounter: Payer: Self-pay | Admitting: Gastroenterology

## 2019-07-12 VITALS — Ht 67.0 in | Wt 237.0 lb

## 2019-07-12 DIAGNOSIS — I2699 Other pulmonary embolism without acute cor pulmonale: Secondary | ICD-10-CM

## 2019-07-12 DIAGNOSIS — Z7901 Long term (current) use of anticoagulants: Secondary | ICD-10-CM

## 2019-07-12 DIAGNOSIS — Z1211 Encounter for screening for malignant neoplasm of colon: Secondary | ICD-10-CM | POA: Diagnosis not present

## 2019-07-12 MED ORDER — CLENPIQ 10-3.5-12 MG-GM -GM/160ML PO SOLN: 1.0000 | ORAL | 0 refills | Status: DC

## 2019-07-12 NOTE — Patient Instructions (Signed)
If you are age 50 or older, your body mass index should be between 23-30. Your Body mass index is 37.12 kg/m. If this is out of the aforementioned range listed, please consider follow up with your Primary Care Provider.  If you are age 78 or younger, your body mass index should be between 19-25. Your Body mass index is 37.12 kg/m. If this is out of the aformentioned range listed, please consider follow up with your Primary Care Provider.   To help prevent the possible spread of infection to our patients, communities, and staff; we will be implementing the following measures:  As of now we are not allowing any visitors/family members to accompany you to any upcoming appointments with [facility]. If you have any concerns about this please contact our office to discuss prior to the appointment.   You have been scheduled for a colonoscopy. Please follow written instructions given to you at your visit today.  Please pick up your prep supplies at the pharmacy within the next 1-3 days. If you use inhalers (even only as needed), please bring them with you on the day of your procedure. Your physician has requested that you go to www.startemmi.com and enter the access code given to you at your visit today. This web site gives a general overview about your procedure. However, you should still follow specific instructions given to you by our office regarding your preparation for the procedure.  We have sent the following medications to your pharmacy for you to pick up at your convenience: Clenpiq  It was a pleasure to see you today!  Vito Cirigliano, D.O.

## 2019-07-12 NOTE — Progress Notes (Signed)
Chief Complaint: CRC screening, systemic anticoagulation  Referring Provider:     Tish Men, MD   HPI:    Due to current restrictions/limitations of in-office visits due to the COVID-19 pandemic, this scheduled clinical appointment was converted to a telehealth virtual consultation using Doxim[employer]. Link failed on patient's mobile phone, so appt converted to telephone call from virtual.   -Time of medical discussion: 21 minutes -The patient did consent to this virtual visit and is aware of possible charges through their insurance for this visit.  -Names of all parties present: Jenna Davenport (patient), Gerrit Heck, DO, [facility] (physician) -Patient location: Home -Physician location: Office  Jenna Davenport is a 48 y.o. female with a history of LLE DVT and bilateral PE in 2017 (unprovoked, currently on Eliquis), referred to the Gastroenterology Clinic for evaluation of CRC screening. No family history of CRC or related malignancies, and patient is without any active GI sxs. Denies any melena, hematochezia, nausea, vomiting, diarrhea, constipation, change in bowel habits, early satiety, or abdominal pain, and no fever, chills, night sweats or weight loss. No previous CRC screening to date.   Recently evaluated by Dr. Maylon Davenport in Hematology. Prior hypercoag w/u was negative. Eliquis recently reduced from 5 mg to 2.5 mg bid for secondary prophylaxis.  Already provided approval to hold Eliquis x2 days prior to colonoscopy, and resume 1 day after colonoscopy. Recent normal CBC, CMP.   Past medical history, past surgical history, social history, family history, medications, and allergies reviewed in the chart and with patient.    Past Medical History:  Diagnosis Date  . Anemia   . Pulmonary embolism [facility])      Past Surgical History:  Procedure Laterality Date  . CHOLECYSTECTOMY     2007  . GASTRIC BYPASS     2011  . TUBAL LIGATION     2008   Family History  Problem Relation  Age of Onset  . Seizures Mother   . Stroke Mother   . Heart failure Mother   . Hyperlipidemia Mother   . Hypertension Father   . Diabetes Father   . Seizures Father   . Hyperlipidemia Father   . Colon cancer Neg Hx    Social History   Tobacco Use  . Smoking status: Never Smoker  . Smokeless tobacco: Never Used  Substance Use Topics  . Alcohol use: Yes    Alcohol/week: 5.0 standard drinks    Types: 5 Standard drinks or equivalent per week    Comment: occasionally  . Drug use: No   Current Outpatient Medications  Medication Sig Dispense Refill  . apixaban (ELIQUIS) 2.5 MG TABS tablet Take 2.5 mg by mouth 2 (two) times daily.    . Multiple Vitamins-Minerals (HAIR SKIN AND NAILS FORMULA) TABS Take 2 tablets by mouth daily.    . valACYclovir (VALTREX) 1000 MG tablet Take 1,000 mg by mouth daily.    Marland Kitchen apixaban (ELIQUIS) 2.5 MG TABS tablet Take 1 tablet (2.5 mg total) by mouth 2 (two) times daily. 180 tablet 3   No current facility-administered medications for this visit.    Allergies  Allergen Reactions  . Pollen Extract Other (See Comments)    Itchy, watery eyes      Review of Systems: All systems reviewed and negative except where noted in HPI.     Physical Exam:    Complete physical exam not completed due to the nature of this telehealth communication.  Gen: Awake, alert, and oriented, and well communicative. Psych: Pleasant, cooperative, normal speech, thought processing seemingly intact   ASSESSMENT AND PLAN;   1) CRC Screening: Jenna Davenport is a 48 y.o. female presenting to the Gastroenterology Clinic for initial CRC screening. No family history of CRC or related malignancies, and patient is without any active GI sxs. No previous CRC screening to date. Discussed options for CRC screening, to include optical vs virtual colonoscopy - the risks and benefits and pros and cons of each, as well as discussion of FIT kit testing, Cologuard, etc, and the patient  decided to proceed with an optical colonoscopy.   - Will schedule date and time for colonoscopy prior to leaving clinic today  - NPO at MN prior to procedure  - Bowel prep ordered with plan for instruction with GI clinical staff - All questions answered  The indications, risks, and benefits of colonoscopy were explained to the patient in detail. Risks include but are not limited to bleeding, perforation, adverse reaction to medications, and cardiopulmonary compromise. Sequelae include but are not limited to the possibility of surgery, hospitalization, and mortality. The patient verbalized understanding and wished to proceed. All questions answered, referred for scheduling and bowel prep ordered. Further recommendations pending results of the exam.    2) Systemic anticoagulation 3) History of DVT/bilateral PE (2017): Per Jenna Davenport's previous recommendations, okay to hold Eliquis x2 days prior to colonoscopy, with plan to resume 1 day after colonoscopy (or later depending on findings and need for polypectomy) - will instruct when and how to resume after procedure.  - Low but real risk of cardiovascular event such as heart attack, stroke, embolism, thrombosis or ischemia/infarct of other organs off Eliquis explained and need to seek urgent help if this occurs. The patient consents to proceed.    Jenna V Cirigliano, DO, FACG  07/12/2019, 1:33 PM   Davenport, Yan, MD  

## 2019-07-18 DIAGNOSIS — R1030 Lower abdominal pain, unspecified: Secondary | ICD-10-CM | POA: Diagnosis not present

## 2019-07-18 DIAGNOSIS — F418 Other specified anxiety disorders: Secondary | ICD-10-CM | POA: Diagnosis not present

## 2019-07-18 DIAGNOSIS — Z6838 Body mass index (BMI) 38.0-38.9, adult: Secondary | ICD-10-CM | POA: Diagnosis not present

## 2019-07-18 DIAGNOSIS — Z13220 Encounter for screening for lipoid disorders: Secondary | ICD-10-CM | POA: Diagnosis not present

## 2019-07-24 IMAGING — MR MR ANKLE*L* W/O CM
5 series · 36 of 40 positions shown · non-contrast
Comparison: None.

CLINICAL DATA: Chronic left ankle pain. Flatfoot deformity.
Evaluate for posterior tibialis tendon injury.

EXAM:
MRI OF THE LEFT ANKLE WITHOUT CONTRAST
TECHNIQUE: Multiplanar, multisequence MR imaging of the ankle was performed. No
intravenous contrast was administered.

[Series 4: T2 fat-sat · axial · 3.0mm · 0.53mm/px · z∈[-84,+37]mm · 9 of 32 slices shown (1 of 3)]
[im 1/32]
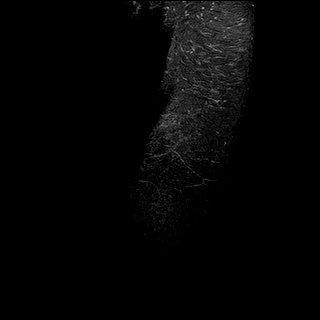
[im 4/32]
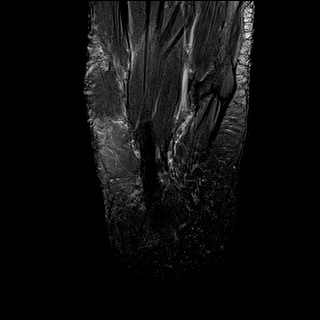
[im 8/32]
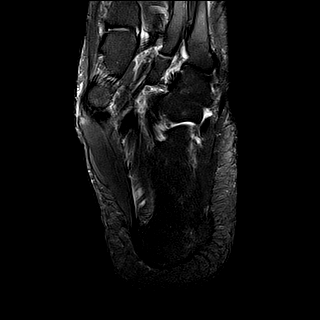
[im 12/32]
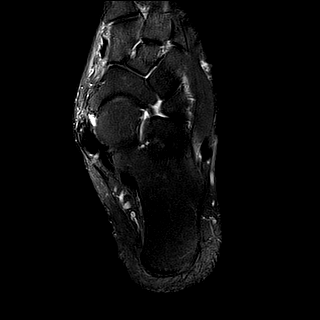
[im 16/32]
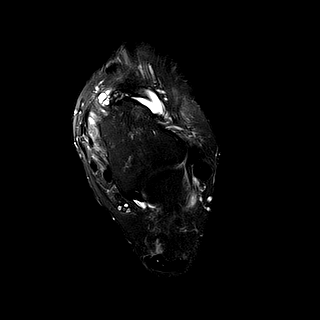
[im 20/32]
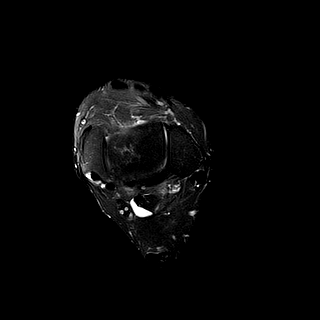
[im 24/32]
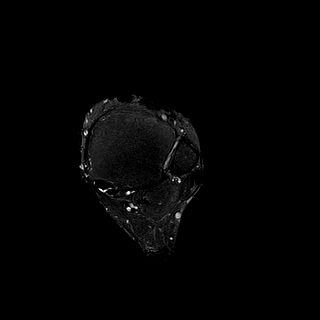
[im 28/32]
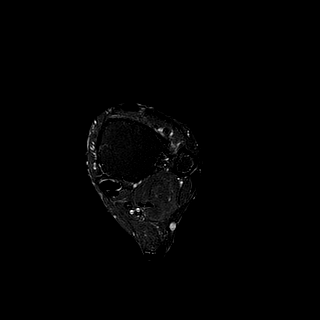
[im 32/32]
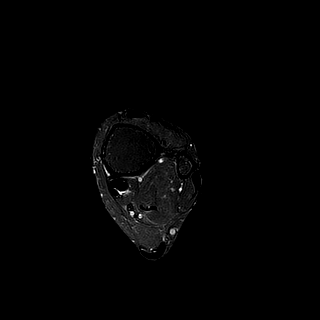

[Series 5: PD fat-sat · axial · 3.0mm · 0.44mm/px · z∈[-84,+37]mm · 8 of 32 slices shown]
[im 1/32]
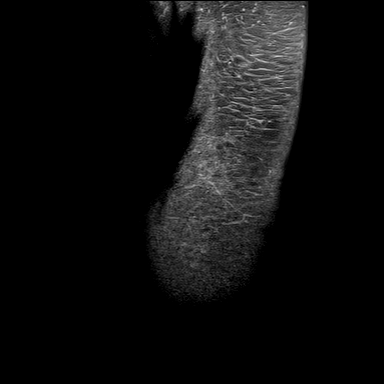
[im 5/32]
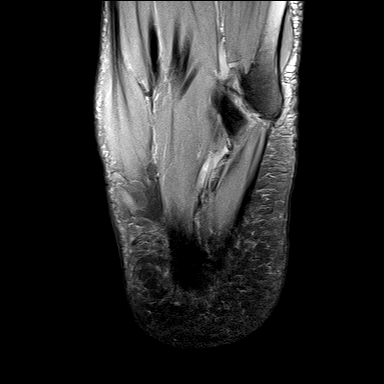
[im 9/32]
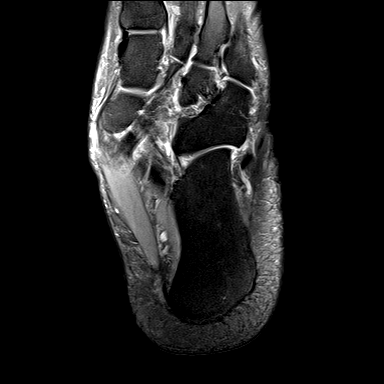
[im 14/32]
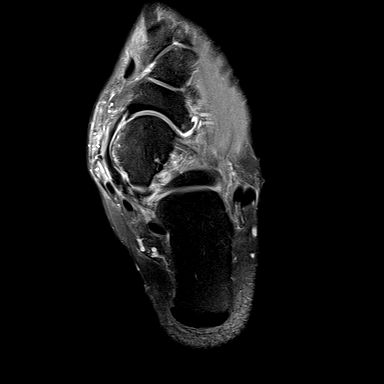
[im 18/32]
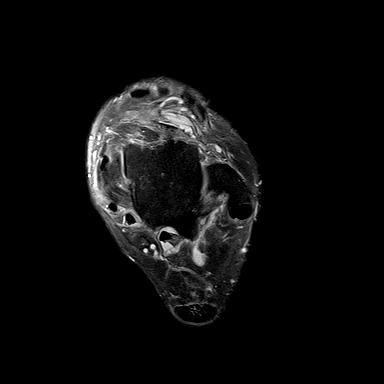
[im 23/32]
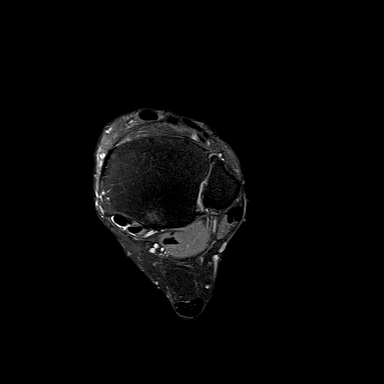
[im 27/32]
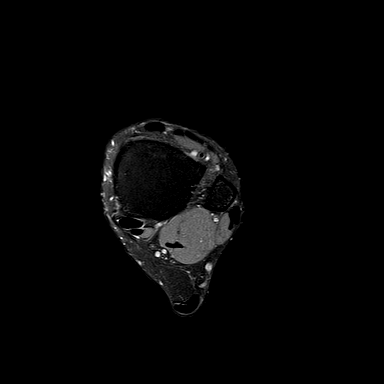
[im 32/32]
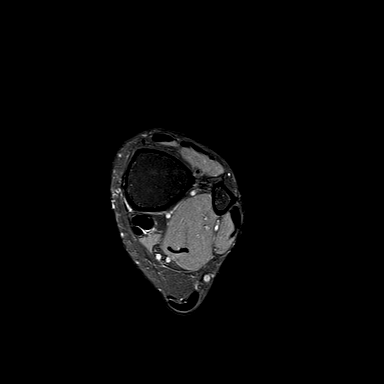

[Series 6: T1 · sagittal · 3.0mm · 0.40mm/px · 3 of 27 slices shown]
[im 1/27]
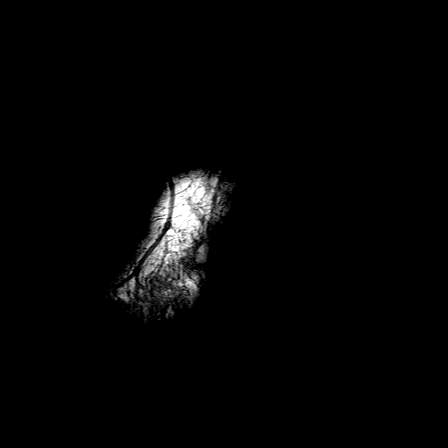
[im 5/27]
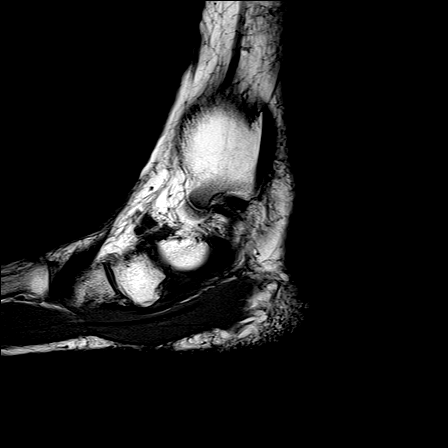
[im 9/27]
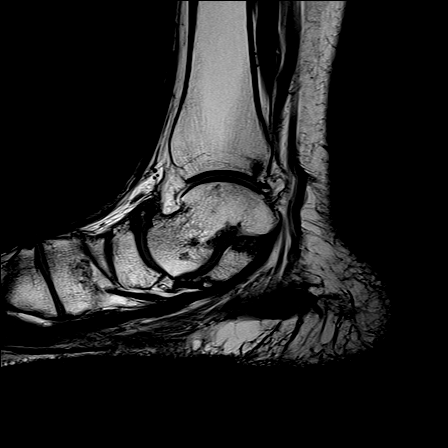

[Series 7: T2 fat-sat · sagittal · 3.0mm · 0.56mm/px · 7 of 27 slices shown (2 of 3)]
[im 1/27]
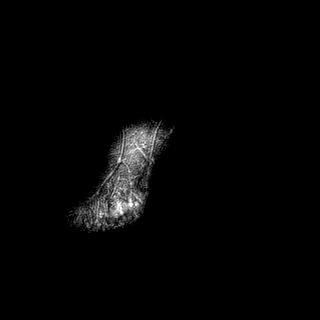
[im 5/27]
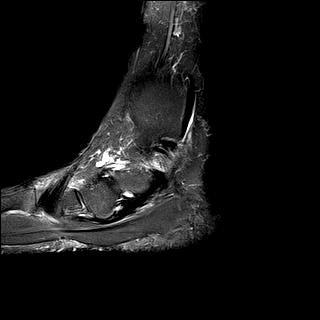
[im 9/27]
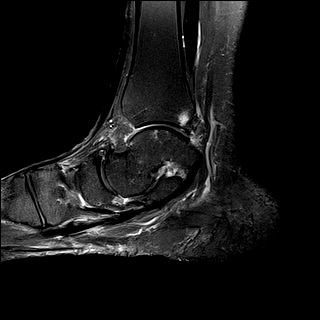
[im 14/27]
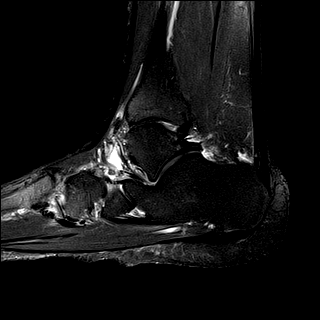
[im 18/27]
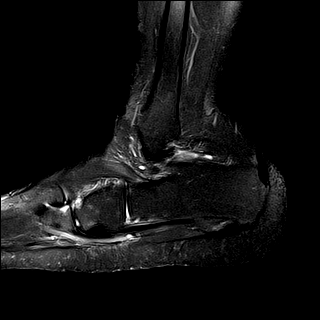
[im 22/27]
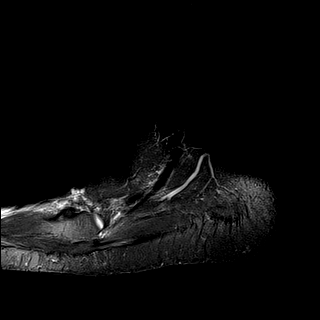
[im 27/27]
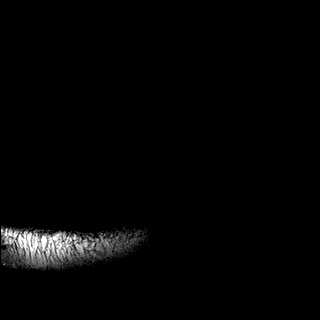

[Series 8: T2 fat-sat · coronal · 3.5mm · 0.47mm/px · 9 of 33 slices shown (3 of 3)]
[im 1/33]
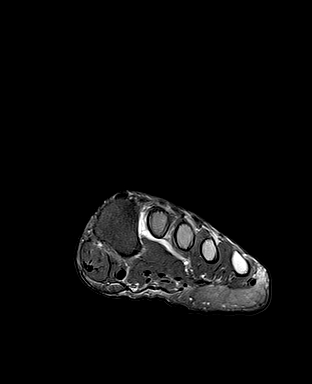
[im 5/33]
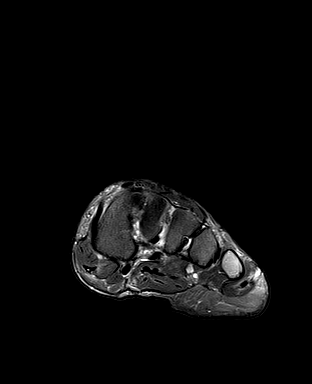
[im 9/33]
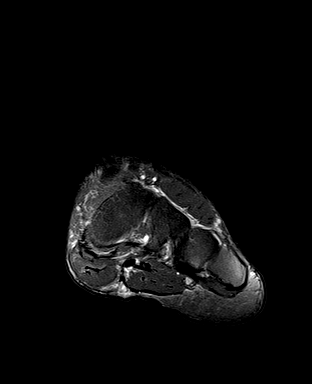
[im 13/33]
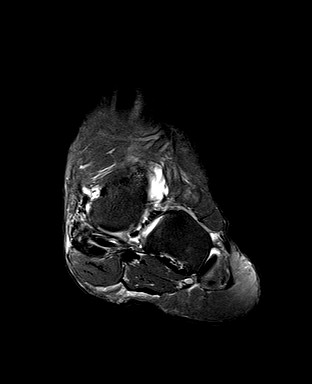
[im 17/33]
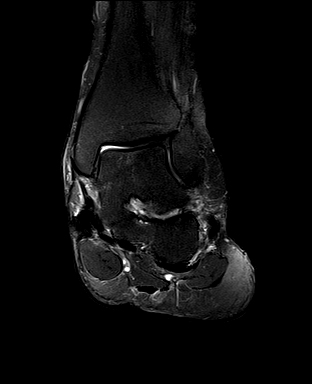
[im 21/33]
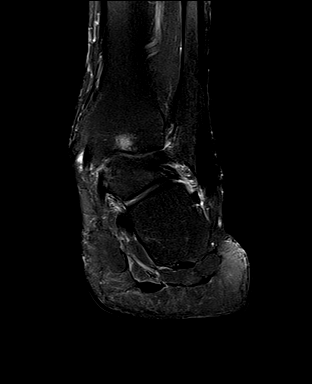
[im 25/33]
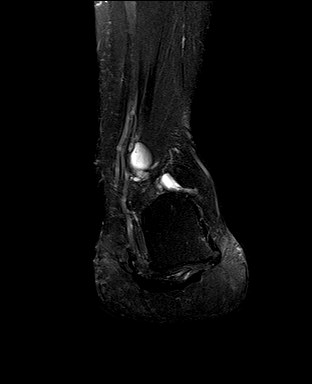
[im 29/33]
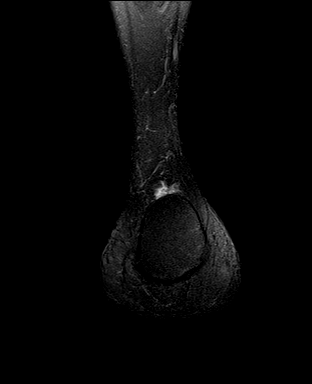
[im 33/33]
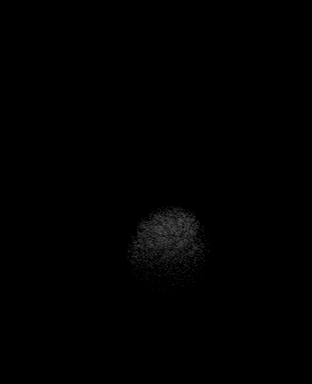

[36 of 40 positions shown; findings below may reference images not displayed]

FINDINGS: TENDONS

Peroneal: Intact peroneus longus and peroneus brevis tendons.

Posteromedial: Focal thickening of the distal posterior tibialis
tendon near the navicular, consistent with tendinosis. No discrete
tear. Mild posterior tibialis, flexor digitorum longus, and flexor
hallucis longus tenosynovitis. The flexor digitorum longus and
flexor hallucis longus tendons are intact.

Anterior: Intact tibialis anterior, extensor hallucis longus and
extensor digitorum longus tendons.

Achilles: Mild tendinosis with low-grade interstitial tearing of the
distal Achilles tendon.

Plantar Fascia: Thickening of the proximal central band, measuring
up to 7 mm. No tear.

LIGAMENTS

Lateral: The anterior and posterior talofibular, calcaneofibular,
and anterior and posterior tibiofibular ligaments are intact.

Medial: Irregularity and partial tearing of the superomedial
component of the spring ligament near its navicular attachment. The
deltoid ligament appears intact.

CARTILAGE

Ankle Joint: Small focal full-thickness cartilage loss over the
posterior tibial plafond with underlying subchondral marrow edema.
Additional full-thickness cartilage loss along the lateral tibial
plafond. The talar dome is intact. No significant tibiotalar joint
effusion.

Subtalar Joints/Sinus Tarsi: Small posterior subtalar joint
effusion. Abnormal increased T2 signal within the sinus tarsi.

Bones: Pes planus. Moderate degenerative changes of the
talonavicular joint with small dorsal joint effusion. Mild
naviculocuneiform and first and second TMT joint degenerative
changes. No fracture or dislocation.

Other: Small amount of edema in the retrocalcaneal bursa.
IMPRESSION: 1. Moderate distal posterior tibialis tendinosis without discrete
tear.
2. Mild tenosynovitis of all three posteromedial tendons.
3. Partial tearing of the superomedial spring ligament near its
navicular attachment.
4. Pes planus with moderate degenerative changes of the
talonavicular joint.
5. Mild tendinosis and low-grade interstitial tearing of the distal
Achilles tendon.
6. Thickening of the plantar fascia, consistent with prior plantar
fasciitis.
7. Small areas of full-thickness cartilage loss over the tibial
plafond, as described above.
8. Abnormal signal within the sinus tarsi. Correlate for sinus tarsi
syndrome.

## 2019-08-09 ENCOUNTER — Telehealth: Payer: Self-pay | Admitting: Gastroenterology

## 2019-08-09 NOTE — Telephone Encounter (Signed)
Pt is scheduled for 8/18 colon but reported discomfort around pelvis area.  Please advise.

## 2019-08-10 NOTE — Telephone Encounter (Signed)
Called and spoke with patient-patient informed of MD recommendations and is agreeable to plan of care; patient reports she is able to wait until her procedure on 08/16/2019 at 10:30 am with Dr. Bryan Lemma for answers to her complaints; patient informed this office will try to obtain medical records from her PCP for review; ]  Nira Conn, Will you please obtain the medical records for this patient in order for Dr. C to review them prior to her procedure on 8/18; Thank you

## 2019-08-10 NOTE — Telephone Encounter (Signed)
She had no GI sxs on her initial appt with me. Can you please find out what tests were done by her PCM. None that I can see in Epic. Can certainly evaluate for lower GI source of sxs at time of colonoscopy. Thanks.

## 2019-08-10 NOTE — Telephone Encounter (Signed)
Called and spoke with patient-patient reports she has been having the abdominal discomfort for about a month now and her PCP "ran some tests but they came back normal and was told to follow up with GI-patient reports she does get some sharp pains (random- not associated with using the restroom or movement); denies blood in stool/toilet/TP, fever; does report bloating, more on right than left side pelvic pain; patient had novasure ablation so no chance of being pregnant/no longer having cycles; bowel movements are normal/no staining per patient;  Please advise as patient is scheduled for her colonoscopy on 08/16/2019;

## 2019-08-11 ENCOUNTER — Encounter: Payer: Self-pay | Admitting: Gastroenterology

## 2019-08-11 NOTE — Telephone Encounter (Signed)
Dr. Loletha Grayer,  Records from [facility] are on your desk.

## 2019-08-12 ENCOUNTER — Telehealth: Payer: Self-pay | Admitting: Gastroenterology

## 2019-08-12 DIAGNOSIS — F418 Other specified anxiety disorders: Secondary | ICD-10-CM | POA: Diagnosis not present

## 2019-08-12 DIAGNOSIS — I10 Essential (primary) hypertension: Secondary | ICD-10-CM | POA: Diagnosis not present

## 2019-08-12 MED ORDER — CLENPIQ 10-3.5-12 MG-GM -GM/160ML PO SOLN: 1.0000 | ORAL | 0 refills | Status: DC

## 2019-08-12 NOTE — Telephone Encounter (Signed)
Rx sent to pharmacy. Pt informed, she states she has been using both [facility] but would like all refills [facility] to [facility] now.

## 2019-08-15 NOTE — Telephone Encounter (Signed)
Called and spoke with patient-patient informed of MD recommendations; patient is agreeable with plan of care;  Patient verbalized understanding of information/instructions;  Patient was advised to call the office at [phone] if questions/concerns arise; 

## 2019-08-15 NOTE — Telephone Encounter (Signed)
Pt is scheduled for colon tomorrow and said she called last week about getting her records from PCP for Dr. Bryan Lemma to review.  She is having abd pain and wants to know if he has reviewed her records prior to her colon tomorrow [phone]

## 2019-08-15 NOTE — Telephone Encounter (Signed)
Records reviewed.  Seen by her PCM on 07/18/2019 at [facility].  Endorsed abdominal/pelvic pain at that time.  Evaluation unrevealing to include normal A1c (5.4), CMP, CBC, UA, urine culture.  Can discuss her abdominal pain tomorrow at her scheduled procedural appointment.  Can similarly evaluate for lower GI etiology at time of colonoscopy.  Thank you.

## 2019-08-15 NOTE — Telephone Encounter (Signed)
Have you reviewed this patient's records? Please advise

## 2019-08-16 ENCOUNTER — Encounter: Payer: Self-pay | Admitting: Gastroenterology

## 2019-08-16 ENCOUNTER — Ambulatory Visit (AMBULATORY_SURGERY_CENTER): Payer: BC Managed Care – PPO | Admitting: Gastroenterology

## 2019-08-16 ENCOUNTER — Other Ambulatory Visit: Payer: Self-pay

## 2019-08-16 VITALS — BP 153/85 | HR 60 | Temp 98.1°F | Resp 14 | Ht 67.0 in | Wt 250.0 lb

## 2019-08-16 DIAGNOSIS — Z1211 Encounter for screening for malignant neoplasm of colon: Secondary | ICD-10-CM

## 2019-08-16 MED ORDER — SODIUM CHLORIDE 0.9 % IV SOLN: 500.0000 mL | Freq: Once | INTRAVENOUS | Status: DC

## 2019-08-16 NOTE — Progress Notes (Signed)
Report given to PACU, vss 

## 2019-08-16 NOTE — Op Note (Signed)
[facility] Patient Name: Jenna Davenport Procedure Date: 08/16/2019 10:43 AM MRN: [id number] Endoscopist: Doristine LocksVito Ileta Ofarrell , MD Age: [age] Referring MD:  Date of Birth: 08/12/1971 Gender: Female Account #: [id number] Procedure:                Colonoscopy Indications:              Screening for colorectal malignant neoplasm, This                            is the patient's first colonoscopy Medicines:                Monitored Anesthesia Care Procedure:                Pre-Anesthesia Assessment:                           - Prior to the procedure, a History and Physical                            was performed, and patient medications and                            allergies were reviewed. The patient's tolerance of                            previous anesthesia was also reviewed. The risks                            and benefits of the procedure and the sedation                            options and risks were discussed with the patient.                            All questions were answered, and informed consent                            was obtained. Prior Anticoagulants: The patient has                            taken Eliquis (apixaban), last dose was 3 days                            prior to procedure. ASA Grade Assessment: III - A                            patient with severe systemic disease. After                            reviewing the risks and benefits, the patient was                            deemed in satisfactory condition to undergo the  procedure.                           After obtaining informed consent, the colonoscope                            was passed under direct vision. Throughout the                            procedure, the patient's blood pressure, pulse, and                            oxygen saturations were monitored continuously. The                            Colonoscope was introduced through the anus and                   advanced to the the cecum, identified by                            appendiceal orifice and ileocecal valve. The                            colonoscopy was performed without difficulty. The                            patient tolerated the procedure well. The quality                            of the bowel preparation was adequate. The                            ileocecal valve, appendiceal orifice, and rectum                            were photographed. Scope In: 10:44:32 AM Scope Out: 51:88:41 AM Scope Withdrawal Time: 0 hours 9 minutes 2 seconds  Total Procedure Duration: 0 hours 11 minutes 46 seconds  Findings:                 The perianal and digital rectal examinations were                            normal.                           The colon (entire examined portion) appeared normal.                           The retroflexed view of the distal rectum and anal                            verge was normal and showed no anal or rectal  abnormalities. Complications:            No immediate complications. Estimated Blood Loss:     Estimated blood loss: none. Impression:               - The entire examined colon is normal.                           - The distal rectum and anal verge are normal on                            retroflexion view.                           - No specimens collected. Recommendation:           - Patient has a contact number available for                            emergencies. The signs and symptoms of potential                            delayed complications were discussed with the                            patient. Return to normal activities tomorrow.                            Written discharge instructions were provided to the                            patient.                           - Resume previous diet today.                           - Continue present medications.                           - Repeat  colonoscopy in 10 years for screening                            purposes.                           - Return to GI office PRN. Doristine LocksVito Tyaira Heward, MD 08/16/2019 11:00:57 AM

## 2019-08-16 NOTE — Progress Notes (Signed)
Pt's states no medical or surgical changes since previsit or office visit. 

## 2019-08-16 NOTE — Patient Instructions (Signed)
YOU HAD AN ENDOSCOPIC PROCEDURE TODAY AT THE [facility]:   Refer to the procedure report that was given to you for any specific questions about what was found during the examination.  If the procedure report does not answer your questions, please call your gastroenterologist to clarify.  If you requested that your care partner not be given the details of your procedure findings, then the procedure report has been included in a sealed envelope for you to review at your convenience later.  YOU SHOULD EXPECT: Some feelings of bloating in the abdomen. Passage of more gas than usual.  Walking can help get rid of the air that was put into your GI tract during the procedure and reduce the bloating. If you had a lower endoscopy (such as a colonoscopy or flexible sigmoidoscopy) you may notice spotting of blood in your stool or on the toilet paper. If you underwent a bowel prep for your procedure, you may not have a normal bowel movement for a few days.  Please Note:  You might notice some irritation and congestion in your nose or some drainage.  This is from the oxygen used during your procedure.  There is no need for concern and it should clear up in a day or so.  SYMPTOMS TO REPORT IMMEDIATELY:   Following lower endoscopy (colonoscopy or flexible sigmoidoscopy):  Excessive amounts of blood in the stool  Significant tenderness or worsening of abdominal pains  Swelling of the abdomen that is new, acute  Fever of 100F or higher  For urgent or emergent issues, a gastroenterologist can be reached at any hour by calling [phone].   DIET:  We do recommend a small meal at first, but then you may proceed to your regular diet.  Drink plenty of fluids but you should avoid alcoholic beverages for 24 hours.  ACTIVITY:  You should plan to take it easy for the rest of today and you should NOT DRIVE or use heavy machinery until tomorrow (because of the sedation medicines used during the test).     FOLLOW UP: Our staff will call the number listed on your records 48-72 hours following your procedure to check on you and address any questions or concerns that you may have regarding the information given to you following your procedure. If we do not reach you, we will leave a message.  We will attempt to reach you two times.  During this call, we will ask if you have developed any symptoms of COVID 19. If you develop any symptoms (ie: fever, flu-like symptoms, shortness of breath, cough etc.) before then, please call [phone].  If you test positive for Covid 19 in the 2 weeks post procedure, please call and report this information to us.    If any biopsies were taken you will be contacted by phone or by letter within the next 1-3 weeks.  Please call us at [phone] if you have not heard about the biopsies in 3 weeks.    SIGNATURES/CONFIDENTIALITY: You and/or your care partner have signed paperwork which will be entered into your electronic medical record.  These signatures attest to the fact that that the information above on your After Visit Summary has been reviewed and is understood.  Full responsibility of the confidentiality of this discharge information lies with you and/or your care-partner. 

## 2019-08-18 ENCOUNTER — Telehealth: Payer: Self-pay

## 2019-08-18 DIAGNOSIS — N6325 Unspecified lump in the left breast, overlapping quadrants: Secondary | ICD-10-CM | POA: Diagnosis not present

## 2019-08-18 DIAGNOSIS — Z1231 Encounter for screening mammogram for malignant neoplasm of breast: Secondary | ICD-10-CM | POA: Diagnosis not present

## 2019-08-18 DIAGNOSIS — N6322 Unspecified lump in the left breast, upper inner quadrant: Secondary | ICD-10-CM | POA: Diagnosis not present

## 2019-08-18 DIAGNOSIS — N6321 Unspecified lump in the left breast, upper outer quadrant: Secondary | ICD-10-CM | POA: Diagnosis not present

## 2019-08-18 NOTE — Telephone Encounter (Signed)
  Follow up Call-  Call back number 08/16/2019  Post procedure Call Back phone  # [phone]  Permission to leave phone message Yes  Some recent data might be hidden     Patient questions:  Do you have a fever, pain , or abdominal swelling? No. Pain Score  0 *  Have you tolerated food without any problems? Yes.    Have you been able to return to your normal activities? Yes.    Do you have any questions about your discharge instructions: Diet   No. Medications  No. Follow up visit  No.  Do you have questions or concerns about your Care? No.  Actions: * If pain score is 4 or above: No action needed, pain <4.  1. Have you developed a fever since your procedure? no  2.   Have you had an respiratory symptoms (SOB or cough) since your procedure? no  3.   Have you tested positive for COVID 19 since your procedure no  4.   Have you had any family members/close contacts diagnosed with the COVID 19 since your procedure?  no   If yes to any of these questions please route to Joylene John, RN and Alphonsa Gin, Therapist, sports.

## 2019-09-01 DIAGNOSIS — F419 Anxiety disorder, unspecified: Secondary | ICD-10-CM | POA: Diagnosis not present

## 2019-09-14 DIAGNOSIS — Z1151 Encounter for screening for human papillomavirus (HPV): Secondary | ICD-10-CM | POA: Diagnosis not present

## 2019-09-14 DIAGNOSIS — Z6838 Body mass index (BMI) 38.0-38.9, adult: Secondary | ICD-10-CM | POA: Diagnosis not present

## 2019-09-14 DIAGNOSIS — Z113 Encounter for screening for infections with a predominantly sexual mode of transmission: Secondary | ICD-10-CM | POA: Diagnosis not present

## 2019-09-14 DIAGNOSIS — Z01419 Encounter for gynecological examination (general) (routine) without abnormal findings: Secondary | ICD-10-CM | POA: Diagnosis not present

## 2019-09-14 DIAGNOSIS — F419 Anxiety disorder, unspecified: Secondary | ICD-10-CM | POA: Diagnosis not present

## 2019-09-30 DIAGNOSIS — R87810 Cervical high risk human papillomavirus (HPV) DNA test positive: Secondary | ICD-10-CM | POA: Diagnosis not present

## 2019-09-30 DIAGNOSIS — Z01812 Encounter for preprocedural laboratory examination: Secondary | ICD-10-CM | POA: Diagnosis not present

## 2019-09-30 DIAGNOSIS — N870 Mild cervical dysplasia: Secondary | ICD-10-CM | POA: Diagnosis not present

## 2019-10-05 DIAGNOSIS — L810 Postinflammatory hyperpigmentation: Secondary | ICD-10-CM | POA: Diagnosis not present

## 2019-10-05 DIAGNOSIS — L281 Prurigo nodularis: Secondary | ICD-10-CM | POA: Diagnosis not present

## 2019-10-10 DIAGNOSIS — R102 Pelvic and perineal pain: Secondary | ICD-10-CM | POA: Diagnosis not present

## 2019-10-11 DIAGNOSIS — F419 Anxiety disorder, unspecified: Secondary | ICD-10-CM | POA: Diagnosis not present

## 2019-10-14 DIAGNOSIS — R1031 Right lower quadrant pain: Secondary | ICD-10-CM | POA: Diagnosis not present

## 2019-10-14 DIAGNOSIS — N83209 Unspecified ovarian cyst, unspecified side: Secondary | ICD-10-CM | POA: Diagnosis not present

## 2019-10-31 DIAGNOSIS — F419 Anxiety disorder, unspecified: Secondary | ICD-10-CM | POA: Diagnosis not present

## 2019-11-07 DIAGNOSIS — I10 Essential (primary) hypertension: Secondary | ICD-10-CM | POA: Diagnosis not present

## 2019-11-07 DIAGNOSIS — F418 Other specified anxiety disorders: Secondary | ICD-10-CM | POA: Diagnosis not present

## 2019-11-21 DIAGNOSIS — R030 Elevated blood-pressure reading, without diagnosis of hypertension: Secondary | ICD-10-CM | POA: Diagnosis not present

## 2019-11-22 DIAGNOSIS — F419 Anxiety disorder, unspecified: Secondary | ICD-10-CM | POA: Diagnosis not present

## 2019-12-13 DIAGNOSIS — F419 Anxiety disorder, unspecified: Secondary | ICD-10-CM | POA: Diagnosis not present

## 2020-01-04 DIAGNOSIS — F419 Anxiety disorder, unspecified: Secondary | ICD-10-CM | POA: Diagnosis not present

## 2020-01-24 DIAGNOSIS — F419 Anxiety disorder, unspecified: Secondary | ICD-10-CM | POA: Diagnosis not present

## 2020-02-01 DIAGNOSIS — N83209 Unspecified ovarian cyst, unspecified side: Secondary | ICD-10-CM | POA: Diagnosis not present

## 2020-02-01 DIAGNOSIS — R232 Flushing: Secondary | ICD-10-CM | POA: Diagnosis not present

## 2020-02-01 DIAGNOSIS — R1031 Right lower quadrant pain: Secondary | ICD-10-CM | POA: Diagnosis not present

## 2020-02-01 DIAGNOSIS — Z6841 Body Mass Index (BMI) 40.0 and over, adult: Secondary | ICD-10-CM | POA: Diagnosis not present

## 2020-02-07 DIAGNOSIS — F419 Anxiety disorder, unspecified: Secondary | ICD-10-CM | POA: Diagnosis not present

## 2020-02-20 DIAGNOSIS — R10813 Right lower quadrant abdominal tenderness: Secondary | ICD-10-CM | POA: Diagnosis not present

## 2020-02-20 DIAGNOSIS — M62838 Other muscle spasm: Secondary | ICD-10-CM | POA: Diagnosis not present

## 2020-02-20 DIAGNOSIS — M6281 Muscle weakness (generalized): Secondary | ICD-10-CM | POA: Diagnosis not present

## 2020-02-20 DIAGNOSIS — N393 Stress incontinence (female) (male): Secondary | ICD-10-CM | POA: Diagnosis not present

## 2020-02-21 DIAGNOSIS — R928 Other abnormal and inconclusive findings on diagnostic imaging of breast: Secondary | ICD-10-CM | POA: Diagnosis not present

## 2020-02-23 DIAGNOSIS — F419 Anxiety disorder, unspecified: Secondary | ICD-10-CM | POA: Diagnosis not present

## 2020-02-29 ENCOUNTER — Ambulatory Visit (INDEPENDENT_AMBULATORY_CARE_PROVIDER_SITE_OTHER): Payer: BC Managed Care – PPO | Admitting: Gastroenterology

## 2020-02-29 ENCOUNTER — Telehealth: Payer: Self-pay

## 2020-02-29 ENCOUNTER — Other Ambulatory Visit: Payer: Self-pay

## 2020-02-29 ENCOUNTER — Encounter: Payer: Self-pay | Admitting: Gastroenterology

## 2020-02-29 VITALS — BP 136/84 | HR 102 | Temp 96.9°F | Ht 67.0 in | Wt 262.0 lb

## 2020-02-29 DIAGNOSIS — Z01818 Encounter for other preprocedural examination: Secondary | ICD-10-CM | POA: Diagnosis not present

## 2020-02-29 DIAGNOSIS — R12 Heartburn: Secondary | ICD-10-CM

## 2020-02-29 DIAGNOSIS — R1013 Epigastric pain: Secondary | ICD-10-CM | POA: Diagnosis not present

## 2020-02-29 DIAGNOSIS — R110 Nausea: Secondary | ICD-10-CM

## 2020-02-29 DIAGNOSIS — R1031 Right lower quadrant pain: Secondary | ICD-10-CM

## 2020-02-29 DIAGNOSIS — Z7901 Long term (current) use of anticoagulants: Secondary | ICD-10-CM
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
## 2020-02-29 NOTE — Patient Instructions (Signed)
You have been scheduled for an endoscopy. Please follow written instructions given to you at your visit today. If you use inhalers (even only as needed), please bring them with you on the day of your procedure. Your physician has requested that you go to www.startemmi.com and enter the access code given to you at your visit today. This web site gives a general overview about your procedure. However, you should still follow specific instructions given to you by our office regarding your preparation for the procedure.  It was a pleasure to see you today!  Vito Cirigliano, D.O.  

## 2020-02-29 NOTE — Telephone Encounter (Signed)
[facility]  Pre-operative Risk Assessment     Request for surgical clearance:     Endoscopy Procedure  What type of surgery is being performed?     EGD  When is this surgery scheduled?     03/15/20  What type of clearance is required ?   Pharmacy  Are there any medications that need to be held prior to surgery and how long? Eliquis 2 day hold  Practice name and name of physician performing surgery?      [facility]  What is your office phone and fax number?      Phone- [phone]  [phone]  Anesthesia type (None, local, MAC, general) ?       MAC

## 2020-03-01 ENCOUNTER — Encounter: Payer: Self-pay | Admitting: Gastroenterology

## 2020-03-01 DIAGNOSIS — M6281 Muscle weakness (generalized): Secondary | ICD-10-CM | POA: Diagnosis not present

## 2020-03-01 DIAGNOSIS — R10813 Right lower quadrant abdominal tenderness: Secondary | ICD-10-CM | POA: Diagnosis not present

## 2020-03-01 DIAGNOSIS — M62838 Other muscle spasm: Secondary | ICD-10-CM | POA: Diagnosis not present

## 2020-03-01 DIAGNOSIS — N393 Stress incontinence (female) (male): Secondary | ICD-10-CM | POA: Diagnosis not present

## 2020-03-01 NOTE — Telephone Encounter (Signed)
Patient notified to inform her that she may hold Eliquis 2 days prior to EGD per Dr Dion Body. Patient cancelled her procedure due to the out of pocket cost. She will call in a few months to reschedule once she meets her deductible.

## 2020-03-01 NOTE — Telephone Encounter (Signed)
Okay to hold Eliquis prior to procedure per protocol.  Thanks.  Dr. Dion Body

## 2020-03-07 DIAGNOSIS — R10813 Right lower quadrant abdominal tenderness: Secondary | ICD-10-CM | POA: Diagnosis not present

## 2020-03-07 DIAGNOSIS — M62838 Other muscle spasm: Secondary | ICD-10-CM | POA: Diagnosis not present

## 2020-03-07 DIAGNOSIS — M6281 Muscle weakness (generalized): Secondary | ICD-10-CM | POA: Diagnosis not present

## 2020-03-07 DIAGNOSIS — N393 Stress incontinence (female) (male): Secondary | ICD-10-CM | POA: Diagnosis not present

## 2020-03-08 DIAGNOSIS — F419 Anxiety disorder, unspecified: Secondary | ICD-10-CM | POA: Diagnosis not present

## 2020-03-14 DIAGNOSIS — M62838 Other muscle spasm: Secondary | ICD-10-CM | POA: Diagnosis not present

## 2020-03-14 DIAGNOSIS — R10813 Right lower quadrant abdominal tenderness: Secondary | ICD-10-CM | POA: Diagnosis not present

## 2020-03-14 DIAGNOSIS — M6281 Muscle weakness (generalized): Secondary | ICD-10-CM | POA: Diagnosis not present

## 2020-03-14 DIAGNOSIS — N393 Stress incontinence (female) (male): Secondary | ICD-10-CM | POA: Diagnosis not present

## 2020-03-15 ENCOUNTER — Encounter: Payer: BC Managed Care – PPO | Admitting: Gastroenterology

## 2020-03-21 DIAGNOSIS — L089 Local infection of the skin and subcutaneous tissue, unspecified: Secondary | ICD-10-CM | POA: Diagnosis not present

## 2020-03-21 DIAGNOSIS — L281 Prurigo nodularis: Secondary | ICD-10-CM | POA: Diagnosis not present

## 2020-04-04 DIAGNOSIS — F419 Anxiety disorder, unspecified: Secondary | ICD-10-CM | POA: Diagnosis not present

## 2020-04-06 DIAGNOSIS — R1031 Right lower quadrant pain: Secondary | ICD-10-CM | POA: Diagnosis not present

## 2020-04-06 DIAGNOSIS — N83201 Unspecified ovarian cyst, right side: Secondary | ICD-10-CM | POA: Diagnosis not present

## 2020-04-06 DIAGNOSIS — R232 Flushing: Secondary | ICD-10-CM | POA: Diagnosis not present

## 2020-04-23 ENCOUNTER — Emergency Department (HOSPITAL_COMMUNITY)
Admission: EM | Admit: 2020-04-23 | Discharge: 2020-04-23 | Disposition: A | Discharge status: Home / Self Care | Payer: BC Managed Care – PPO | Attending: Emergency Medicine | Admitting: Emergency Medicine

## 2020-04-23 ENCOUNTER — Encounter (HOSPITAL_COMMUNITY): Payer: Self-pay

## 2020-04-23 DIAGNOSIS — R519 Headache, unspecified: Secondary | ICD-10-CM | POA: Insufficient documentation

## 2020-04-23 DIAGNOSIS — Z7901 Long term (current) use of anticoagulants: Secondary | ICD-10-CM | POA: Insufficient documentation

## 2020-04-23 DIAGNOSIS — R42 Dizziness and giddiness: Secondary | ICD-10-CM | POA: Diagnosis not present

## 2020-04-23 DIAGNOSIS — Z86711 Personal history of pulmonary embolism: Secondary | ICD-10-CM | POA: Diagnosis not present

## 2020-04-23 DIAGNOSIS — Z79899 Other long term (current) drug therapy: Secondary | ICD-10-CM | POA: Insufficient documentation

## 2020-04-23 DIAGNOSIS — G8929 Other chronic pain: Secondary | ICD-10-CM | POA: Diagnosis not present

## 2020-04-23 DIAGNOSIS — I10 Essential (primary) hypertension: Secondary | ICD-10-CM | POA: Diagnosis not present

## 2020-04-23 DIAGNOSIS — Z86718 Personal history of other venous thrombosis and embolism: Secondary | ICD-10-CM | POA: Insufficient documentation

## 2020-04-23 MED ORDER — MECLIZINE HCL 25 MG PO TABS
25.0000 mg | ORAL_TABLET | Freq: Once | ORAL | Status: AC
  Administered 2020-04-23: 25 mg via ORAL
  Filled 2020-04-23: qty 1

## 2020-04-23 MED ORDER — SODIUM CHLORIDE 0.9 % IV BOLUS
1000.0000 mL | Freq: Once | INTRAVENOUS | Status: AC
  Administered 2020-04-23: 1000 mL via INTRAVENOUS

## 2020-04-23 MED ORDER — MECLIZINE HCL 25 MG PO TABS: 25.0000 mg | ORAL_TABLET | Freq: Three times a day (TID) | ORAL | 0 refills | Status: DC | PRN

## 2020-04-23 MED ORDER — DIPHENHYDRAMINE HCL 50 MG/ML IJ SOLN
25.0000 mg | Freq: Once | INTRAMUSCULAR | Status: AC
  Administered 2020-04-23: 25 mg via INTRAVENOUS
  Filled 2020-04-23: qty 1

## 2020-04-23 MED ORDER — PROCHLORPERAZINE EDISYLATE 10 MG/2ML IJ SOLN
10.0000 mg | Freq: Once | INTRAMUSCULAR | Status: AC
  Administered 2020-04-23: 10 mg via INTRAVENOUS
  Filled 2020-04-23: qty 2

## 2020-04-23 NOTE — Discharge Instructions (Signed)
Your history and exams are consistent with atypical migrainous type headache with the photosensitivity and loud noise sensitivity and your symptoms greatly improved after the medications.  I also suspect you may have mild vertigo causing the dizziness and lightheadedness with head and body movement.  As the symptoms have improved after fluids and medications, we feel you are safe for discharge home however if you develop any new or worsened symptoms, you may need to return for a further advanced work-up as we discussed.  Please rest and stay hydrated and follow-up with your PCP.

## 2020-04-23 NOTE — ED Notes (Signed)
Full rainbow sent to lab.  

## 2020-04-23 NOTE — ED Provider Notes (Signed)
[facility]-EMERGENCY DEPT Provider Note   CSN: [id number] Arrival date & time: 04/23/20  0547     History Chief Complaint  Patient presents with  . Dizziness    Jenna Davenport is a 49 y.o. female.  The history is provided by the patient and medical records. No language interpreter was used.  Headache Pain location:  Generalized Quality:  Dull Severity currently:  5/10 Severity at highest:  Unable to specify Onset quality:  Gradual Duration:  15 weeks Timing:  Constant Progression:  Waxing and waning Chronicity:  Chronic Similar to prior headaches: yes   Context: bright light, emotional stress and loud noise   Relieved by: dark rooms. Worsened by:  Light and sound Ineffective treatments:  None tried Associated symptoms: dizziness and photophobia   Associated symptoms: no abdominal pain, no back pain, no blurred vision, no congestion, no cough, no diarrhea, no drainage, no eye pain, no facial pain, no fatigue, no fever, no focal weakness, no nausea, no near-syncope, no neck pain, no neck stiffness, no numbness, no seizures, no sinus pressure, no syncope, no tingling, no URI, no visual change, no vomiting and no weakness        Past Medical History:  Diagnosis Date  . Anemia   . Pulmonary embolism [facility])     Patient Active Problem List   Diagnosis Date Noted  . Acute deep vein thrombosis (DVT) of popliteal vein of left lower extremity (HCC) 11/11/2016  . Essential hypertension 11/11/2016  . Tachycardia 11/09/2016  . Dyspnea on effort 11/09/2016  . Rash 11/09/2016  . Hematuria 11/09/2016  . Iron deficiency anemia 11/09/2016  . Acute pulmonary embolism (HCC) 11/08/2016  . Obesity 11/08/2016  . Acute right flank pain 11/08/2016    Past Surgical History:  Procedure Laterality Date  . CHOLECYSTECTOMY     2007  . FOOT TENDON SURGERY Left 11/2017  . GASTRIC BYPASS     2011  . TUBAL LIGATION     2008     OB History   No obstetric history  on file.     Family History  Problem Relation Age of Onset  . Seizures Mother   . Stroke Mother   . Heart failure Mother   . Hyperlipidemia Mother   . Hypertension Father   . Diabetes Father   . Seizures Father   . Hyperlipidemia Father   . Colon cancer Neg Hx     Social History   Tobacco Use  . Smoking status: Never Smoker  . Smokeless tobacco: Never Used  Substance Use Topics  . Alcohol use: Yes    Alcohol/week: 5.0 standard drinks    Types: 5 Standard drinks or equivalent per week    Comment: occasionally  . Drug use: No    Home Medications Prior to Admission medications   Medication Sig Start Date End Date Taking? Authorizing Provider  apixaban (ELIQUIS) 2.5 MG TABS tablet Take 2.5 mg by mouth 2 (two) times daily.    [provider]  Multiple Vitamins-Minerals (HAIR SKIN AND NAILS FORMULA) TABS Take 2 tablets by mouth daily.    [provider]  sertraline (ZOLOFT) 50 MG tablet Take 50 mg by mouth daily.    [provider]  valACYclovir (VALTREX) 1000 MG tablet Take 1,000 mg by mouth daily.    [provider]    Allergies    Pollen extract  Review of Systems   Review of Systems  Constitutional: Negative for chills, fatigue and fever.  HENT: Negative  for congestion, postnasal drip and sinus pressure.   Eyes: Positive for photophobia. Negative for blurred vision and pain.  Respiratory: Negative for cough, chest tightness, shortness of breath and wheezing.   Cardiovascular: Negative for chest pain, syncope and near-syncope.  Gastrointestinal: Negative for abdominal pain, constipation, diarrhea, nausea and vomiting.  Genitourinary: Negative for dysuria and flank pain.  Musculoskeletal: Negative for back pain, neck pain and neck stiffness.  Neurological: Positive for dizziness, light-headedness and headaches. Negative for focal weakness, seizures, syncope, facial asymmetry, speech difficulty, weakness and numbness.    Psychiatric/Behavioral: Negative for agitation.    Physical Exam Updated Vital Signs BP (!) 171/93   Pulse (!) 52   Temp 97.8 F (36.6 C) (Oral)   Resp 15   Ht 5\' 7"  (1.702 m)   Wt 112.9 kg   SpO2 100%   BMI 39.00 kg/m   Physical Exam Vitals and nursing note reviewed.  Constitutional:      General: She is not in acute distress.    Appearance: She is well-developed. She is not ill-appearing, toxic-appearing or diaphoretic.  HENT:     Head: Normocephalic and atraumatic.     Right Ear: External ear normal.     Left Ear: External ear normal.     Nose: Nose normal. No congestion or rhinorrhea.     Mouth/Throat:     Mouth: Mucous membranes are moist.     Pharynx: No oropharyngeal exudate or posterior oropharyngeal erythema.  Eyes:     Extraocular Movements: Extraocular movements intact.     Conjunctiva/sclera: Conjunctivae normal.     Pupils: Pupils are equal, round, and reactive to light.  Cardiovascular:     Rate and Rhythm: Normal rate.     Pulses: Normal pulses.     Heart sounds: No murmur.  Pulmonary:     Effort: Pulmonary effort is normal. No respiratory distress.     Breath sounds: No stridor. No wheezing, rhonchi or rales.  Chest:     Chest wall: No tenderness.  Abdominal:     General: Abdomen is flat. There is no distension.     Tenderness: There is no abdominal tenderness. There is no right CVA tenderness, left CVA tenderness or rebound.  Musculoskeletal:        General: No tenderness or signs of injury.     Cervical back: Normal range of motion and neck supple. No tenderness.     Right lower leg: No edema.     Left lower leg: No edema.  Skin:    General: Skin is warm.     Capillary Refill: Capillary refill takes less than 2 seconds.     Coloration: Skin is not pale.     Findings: No erythema or rash.  Neurological:     General: No focal deficit present.     Mental Status: She is alert and oriented to person, place, and time.     Cranial Nerves: No  cranial nerve deficit.     Sensory: No sensory deficit.     Motor: No weakness or abnormal muscle tone.     Coordination: Coordination normal.     Deep Tendon Reflexes: Reflexes are normal and symmetric.  Psychiatric:        Mood and Affect: Mood normal.     ED Results / Procedures / Treatments   Labs (all labs ordered are listed, but only abnormal results are displayed) Labs Reviewed - No data to display  EKG None  Radiology No results found.  Procedures Procedures (  including critical care time)  Medications Ordered in ED Medications  prochlorperazine (COMPAZINE) injection 10 mg (10 mg Intravenous Given 04/23/20 0827)  diphenhydrAMINE (BENADRYL) injection 25 mg (25 mg Intravenous Given 04/23/20 0825)  meclizine (ANTIVERT) tablet 25 mg (25 mg Oral Given 04/23/20 0822)  sodium chloride 0.9 % bolus 1,000 mL (1,000 mLs Intravenous New Bag/Given 04/23/20 0827)    ED Course  I have reviewed the triage vital signs and the nursing notes.  Pertinent labs & imaging results that were available during my care of the patient were reviewed by me and considered in my medical decision making (see chart for details).    MDM Rules/Calculators/A&P                      Jenna Davenport is a 49 y.o. female with a past medical history significant for prior DVT/PE on anticoagulation therapy with Eliquis, hypertension, and prior gastric bypass surgery who presents with increased stress, anxiety, fatigue, and several months of intermittent dizziness and headaches.  Patient reports that she has had many life stressors that have been worsening including both of her children graduating from college in the next few weeks and moving to big cities, the loss of her brother and parents recently, and just general stressors.  She reports that she has not been taking blood pressure medicine recently and notes her blood pressure has been creeping up.  She says that for months she has been having headaches that are  associated with photophobia and phonophobia and she is also had intermittent episodes of dizziness that seem to be worse when she stands up or moves and twists her head around.  She thinks that she has vertigo.  She presents today because the headache was more severe, a 5 out of 10 in severity, over the last few days and she was having more of the lightheaded episodes.  She says she did not have much to drink yesterday and think she could be dehydrated.  She denies any numbness, tingling, or focal weakness.  She denies any vision changes or speech difficulties.  She denies any stroke symptoms otherwise.  She reports that her blood pressure has been more elevated but she thinks it could also be due to stress.  She denies any fevers, chills, chest pain, shortness breath, palpitations.  She denies any neck stiffness, neck injuries, neck pain, or other complaints.  On exam, no focal neurologic deficits.  Normal sensation and strength in all extremities.  Normal finger-nose-finger testing.  Neck is not stiff and there is no nuchal rigidity.  Lungs clear and chest is nontender.  Abdomen is nontender.  Pupils are symmetric reactive with normal extraocular movements.  Symmetric smile.  Patient is very well-appearing but does have photophobia and phonophobia and sitting in the darkness.  Had a long shared decision made conversation with the patient about work-up.  As her symptoms do seem to be more consistent with a atypical migrainous type headache versus some dehydration with lightheadedness and vertigo, patient is amenable to trying a headache cocktail with fluids and meclizine to see if this helps her.  She is feeling much better, she agreed to hold on any imaging, advanced imaging, or laboratory testing.  She would rather follow-up with her primary team for discussions on blood pressure management if her blood pressure improves here.  Given her well appearance and reassuring exam, this was a reasonable plan.  We  will give her the headache cocktail and reassess.  If she  is feeling better, dissipate discharge home as planned but if she does not improve, will consider further work-up.  12:31 PM After the medications, patient is feeling much better.  Headache has resolved and she no longer feels dizzy or lightheaded.  She walked to the bathroom and only felt a slight dizziness when she stood up.  She reports she is feeling much better and would like to go home.  She will be given prescription for meclizine and will follow up with her PCP.  If she gets any new or worsened symptoms, we discussed returning for further more advanced imaging and work-up.  Patient has no other questions or concerns and was discharged in good condition.    Final Clinical Impression(s) / ED Diagnoses Final diagnoses:  Dizziness  Lightheadedness  Chronic nonintractable headache, unspecified headache type    Rx / DC Orders ED Discharge Orders         Ordered    meclizine (ANTIVERT) 25 MG tablet  3 times daily PRN     04/23/20 1232         Clinical Impression: 1. Dizziness   2. Lightheadedness   3. Chronic nonintractable headache, unspecified headache type     Disposition: Discharge  Condition: Good  I have discussed the results, Dx and Tx plan with the pt(& family if present). He/she/they expressed understanding and agree(s) with the plan. Discharge instructions discussed at great length. Strict return precautions discussed and pt &/or family have verbalized understanding of the instructions. No further questions at time of discharge.    New Prescriptions   MECLIZINE (ANTIVERT) 25 MG TABLET    Take 1 tablet (25 mg total) by mouth 3 (three) times daily as needed for dizziness.    Follow Up: Christa See, [place] [id number] [phone]      [facility] [place] [id number] mc [address] [place]         Lc Joynt, Gwenyth Allegra, MD 04/23/20 [phone]

## 2020-04-23 NOTE — ED Triage Notes (Signed)
Pt reports dizziness and headache on and off for the last few months. Also reports high BPs lately that she attributes to stress. No unilateral weakness or numbness. No facial droop, slurred speech or vision changes. A&Ox4. Ambulatory,

## 2020-04-24 DIAGNOSIS — R42 Dizziness and giddiness: Secondary | ICD-10-CM | POA: Diagnosis not present

## 2020-04-24 DIAGNOSIS — I10 Essential (primary) hypertension: Secondary | ICD-10-CM | POA: Diagnosis not present

## 2020-04-24 DIAGNOSIS — Z09 Encounter for follow-up examination after completed treatment for conditions other than malignant neoplasm: Secondary | ICD-10-CM | POA: Diagnosis not present

## 2020-04-24 DIAGNOSIS — F419 Anxiety disorder, unspecified: Secondary | ICD-10-CM | POA: Diagnosis not present

## 2020-04-24 DIAGNOSIS — R519 Headache, unspecified: Secondary | ICD-10-CM | POA: Diagnosis not present

## 2020-04-26 ENCOUNTER — Other Ambulatory Visit: Payer: Self-pay | Admitting: Hematology

## 2020-05-01 DIAGNOSIS — I10 Essential (primary) hypertension: Secondary | ICD-10-CM | POA: Diagnosis not present

## 2020-05-01 DIAGNOSIS — F418 Other specified anxiety disorders: Secondary | ICD-10-CM | POA: Diagnosis not present

## 2020-05-16 DIAGNOSIS — F419 Anxiety disorder, unspecified: Secondary | ICD-10-CM | POA: Diagnosis not present

## 2020-06-06 DIAGNOSIS — F419 Anxiety disorder, unspecified: Secondary | ICD-10-CM | POA: Diagnosis not present

## 2021-01-10 ENCOUNTER — Ambulatory Visit: Payer: BLUE CROSS/BLUE SHIELD | Admitting: Podiatry

## 2021-01-18 ENCOUNTER — Ambulatory Visit: Payer: BC Managed Care – PPO | Admitting: Podiatry

## 2021-01-23 ENCOUNTER — Ambulatory Visit: Payer: BC Managed Care – PPO | Admitting: Podiatry

## 2021-04-05 ENCOUNTER — Other Ambulatory Visit: Payer: Self-pay

## 2021-04-05 ENCOUNTER — Encounter: Payer: Self-pay | Admitting: Podiatry

## 2021-04-05 ENCOUNTER — Ambulatory Visit (INDEPENDENT_AMBULATORY_CARE_PROVIDER_SITE_OTHER): Payer: BC Managed Care – PPO | Admitting: Podiatry

## 2021-04-05 ENCOUNTER — Other Ambulatory Visit: Payer: Self-pay | Admitting: Podiatry

## 2021-04-05 ENCOUNTER — Ambulatory Visit (INDEPENDENT_AMBULATORY_CARE_PROVIDER_SITE_OTHER): Payer: BC Managed Care – PPO

## 2021-04-05 DIAGNOSIS — M19072 Primary osteoarthritis, left ankle and foot: Secondary | ICD-10-CM | POA: Diagnosis not present

## 2021-04-05 DIAGNOSIS — Z9889 Other specified postprocedural states: Secondary | ICD-10-CM

## 2021-04-05 DIAGNOSIS — M778 Other enthesopathies, not elsewhere classified: Secondary | ICD-10-CM

## 2021-04-09 ENCOUNTER — Encounter: Payer: Self-pay | Admitting: Podiatry

## 2021-04-09 NOTE — Progress Notes (Signed)
Subjective:  Patient ID: Jenna Davenport, female    DOB: 23-Aug-1971,  MRN: [id number]  Chief Complaint  Patient presents with  . Post-op Problem    PT stated that she is having pain across the top of her foot     50 y.o. female presents with the above complaint.  Patient presents with complaint of left dorsal ankle joint pain.  Patient states his pain across the top of her foot.  She sometimes has popping sounds that keeps going in and out and it is very painful.  Patient was previously treated by Dr. Al Corpus for tearing of the posterior tibial tendon.  She states that the pain is about the same it hurts with ambulation.  She does not like the popping sound as it causes her pain.  She has not tried any other treatment options she has not seen anyone else prior to seeing me for this.   Review of Systems: Negative except as noted in the HPI. Denies N/V/F/Ch.  Past Medical History:  Diagnosis Date  . Anemia   . Pulmonary embolism (HCC)     Current Outpatient Medications:  .  ELIQUIS 2.5 MG TABS tablet, TAKE 1 TABLET TWICE A DAY, Disp: 180 tablet, Rfl: 3 .  meclizine (ANTIVERT) 25 MG tablet, Take 1 tablet (25 mg total) by mouth 3 (three) times daily as needed for dizziness., Disp: 30 tablet, Rfl: 0 .  Multiple Vitamins-Minerals (HAIR SKIN AND NAILS FORMULA) TABS, Take 2 tablets by mouth daily., Disp: , Rfl:  .  sertraline (ZOLOFT) 50 MG tablet, Take 50 mg by mouth daily., Disp: , Rfl:  .  valACYclovir (VALTREX) 1000 MG tablet, Take 1,000 mg by mouth daily., Disp: , Rfl:   Current Facility-Administered Medications:  .  0.9 %  sodium chloride infusion, 500 mL, Intravenous, Once, Cirigliano, Vito V, DO  Social History   Tobacco Use  Smoking Status Never Smoker  Smokeless Tobacco Never Used    Allergies  Allergen Reactions  . Bee Pollen Other (See Comments) and Swelling    Itchy, watery eyes   . Pollen Extract Other (See Comments)    Itchy, watery eyes    Objective:  There were no  vitals filed for this visit. There is no height or weight on file to calculate BMI. Constitutional Well developed. Well nourished.  Vascular Dorsalis pedis pulses palpable bilaterally. Posterior tibial pulses palpable bilaterally. Capillary refill normal to all digits.  No cyanosis or clubbing noted. Pedal hair growth normal.  Neurologic Normal speech. Oriented to person, place, and time. Epicritic sensation to light touch grossly present bilaterally.  Dermatologic Nails well groomed and normal in appearance. No open wounds. No skin lesions.  Orthopedic:  Pain on palpation across the extensor tendon on the dorsal aspect of the foot.  Pain with resisted dorsiflexion of the foot.  Some pain with resisted dorsiflexion of the digits.  No pain at the metatarsophalangeal joint 1 through 5.  No deep intra-articular pain noted.  Pain on palpation talonavicular joint.  Mild pain with palpation with range of motion of the talonavicular joint.   Radiographs: 3 views of skeletally mature adult left foot: No fractures noted.  Also osteoarthritic changes noted at the TN joint with dorsal spurring.  No arthritic changes noted at the subtalar joint or the ankle joint. Assessment:   1. Extensor tendinitis of foot   2. Arthritis of foot, left    Plan:  Patient was evaluated and treated and all questions answered.  Left extensor tendinitis  versus talonavicular joint arthritis -I explained to the patient the etiology of arthritis and tendinitis and various treatment options were extensively discussed.  Given that patient has not had adequate rest of the foot or immobilization I believe patient will benefit from cam boot immobilization.  Patient already has a cam boot at home.  I have asked her to place her self in the boot for next 4 weeks and allow the soft tissue to heal appropriately.  If there is no resolve meant will discuss steroid injection at that time.  Patient states understanding. -I will plan on  doing either a steroid injection versus an MRI during next clinical visit.  No follow-ups on file.

## 2021-04-19 ENCOUNTER — Telehealth: Payer: Self-pay | Admitting: Podiatry

## 2021-04-19 ENCOUNTER — Encounter: Payer: Self-pay | Admitting: Podiatry

## 2021-04-19 NOTE — Telephone Encounter (Signed)
Patient calling to state that her employer needs her note for work accommodations to be rewritten to state "indefiantely" instead of "until further notice". Please advise.

## 2021-04-22 ENCOUNTER — Encounter: Payer: Self-pay | Admitting: *Deleted

## 2021-04-22 ENCOUNTER — Encounter: Payer: Self-pay | Admitting: Podiatry

## 2021-04-22 ENCOUNTER — Telehealth: Payer: Self-pay | Admitting: *Deleted

## 2021-04-22 NOTE — Telephone Encounter (Signed)
Patient is calling and would like to have her work note revised , needs a specific date or to state returning to work indefinite, had spoken with Dr Allena Katz concerning this at visit.Please advise.

## 2021-04-22 NOTE — Telephone Encounter (Signed)
If they don't accept indefinitely. Then lets do 8 weeks

## 2021-04-24 ENCOUNTER — Encounter: Payer: Self-pay | Admitting: Podiatry

## 2021-05-10 ENCOUNTER — Ambulatory Visit: Payer: BC Managed Care – PPO | Admitting: Podiatry

## 2021-05-10 ENCOUNTER — Other Ambulatory Visit: Payer: Self-pay

## 2021-05-10 DIAGNOSIS — Z8249 Family history of ischemic heart disease and other diseases of the circulatory system: Secondary | ICD-10-CM | POA: Insufficient documentation

## 2021-05-10 DIAGNOSIS — N912 Amenorrhea, unspecified: Secondary | ICD-10-CM | POA: Insufficient documentation

## 2021-05-10 DIAGNOSIS — M778 Other enthesopathies, not elsewhere classified: Secondary | ICD-10-CM

## 2021-05-10 DIAGNOSIS — Z86711 Personal history of pulmonary embolism: Secondary | ICD-10-CM | POA: Insufficient documentation

## 2021-05-10 DIAGNOSIS — I82402 Acute embolism and thrombosis of unspecified deep veins of left lower extremity: Secondary | ICD-10-CM | POA: Insufficient documentation

## 2021-05-10 DIAGNOSIS — Z86718 Personal history of other venous thrombosis and embolism: Secondary | ICD-10-CM | POA: Insufficient documentation

## 2021-05-10 DIAGNOSIS — I8390 Asymptomatic varicose veins of unspecified lower extremity: Secondary | ICD-10-CM | POA: Insufficient documentation

## 2021-05-10 DIAGNOSIS — R4589 Other symptoms and signs involving emotional state: Secondary | ICD-10-CM | POA: Insufficient documentation

## 2021-05-10 DIAGNOSIS — M19072 Primary osteoarthritis, left ankle and foot: Secondary | ICD-10-CM

## 2021-05-10 DIAGNOSIS — A6000 Herpesviral infection of urogenital system, unspecified: Secondary | ICD-10-CM | POA: Insufficient documentation

## 2021-05-10 DIAGNOSIS — N921 Excessive and frequent menstruation with irregular cycle: Secondary | ICD-10-CM | POA: Insufficient documentation

## 2021-05-10 DIAGNOSIS — F418 Other specified anxiety disorders: Secondary | ICD-10-CM | POA: Insufficient documentation

## 2021-05-14 ENCOUNTER — Encounter: Payer: Self-pay | Admitting: Podiatry

## 2021-05-14 NOTE — Progress Notes (Signed)
Subjective:  Patient ID: Jenna Davenport, female    DOB: 05-08-1971,  MRN: [id number]  Chief Complaint  Patient presents with  . Follow-up    Improvement, stopped wearing boot since Tuesday. Denies any other concerns.     50 y.o. female presents with the above complaint.  Patient presents with follow-up of left dorsal ankle joint pain likely talonavicular joint arthritis.  Patient states that the pain has improved especially while being immobilized with a cam boot.  She has since transition out of the boot into regular shoes.  She still notices some pain but overall much improved.  At this time she is about 80 to 90% improved   Review of Systems: Negative except as noted in the HPI. Denies N/V/F/Ch.  Past Medical History:  Diagnosis Date  . Anemia   . Pulmonary embolism (HCC)     Current Outpatient Medications:  .  chlorthalidone (HYGROTON) 25 MG tablet, 1 tablet in the morning with food, Disp: , Rfl:  .  cholecalciferol (VITAMIN D3) 25 MCG (1000 UNIT) tablet, 1 tablet, Disp: , Rfl:  .  ELIQUIS 2.5 MG TABS tablet, TAKE 1 TABLET TWICE A DAY, Disp: 180 tablet, Rfl: 3 .  meclizine (ANTIVERT) 25 MG tablet, Take 1 tablet (25 mg total) by mouth 3 (three) times daily as needed for dizziness., Disp: 30 tablet, Rfl: 0 .  Multiple Vitamins-Minerals (HAIR SKIN AND NAILS FORMULA) TABS, Take 2 tablets by mouth daily., Disp: , Rfl:  .  sertraline (ZOLOFT) 50 MG tablet, Take 50 mg by mouth daily., Disp: , Rfl:  .  valACYclovir (VALTREX) 1000 MG tablet, Take 1,000 mg by mouth daily., Disp: , Rfl:   Current Facility-Administered Medications:  .  0.9 %  sodium chloride infusion, 500 mL, Intravenous, Once, Cirigliano, Vito V, DO  Social History   Tobacco Use  Smoking Status Never Smoker  Smokeless Tobacco Never Used    Allergies  Allergen Reactions  . Bee Pollen Other (See Comments) and Swelling    Itchy, watery eyes   . Pollen Extract Other (See Comments)    Itchy, watery eyes     Objective:  There were no vitals filed for this visit. There is no height or weight on file to calculate BMI. Constitutional Well developed. Well nourished.  Vascular Dorsalis pedis pulses palpable bilaterally. Posterior tibial pulses palpable bilaterally. Capillary refill normal to all digits.  No cyanosis or clubbing noted. Pedal hair growth normal.  Neurologic Normal speech. Oriented to person, place, and time. Epicritic sensation to light touch grossly present bilaterally.  Dermatologic Nails well groomed and normal in appearance. No open wounds. No skin lesions.  Orthopedic:  Mild pain on palpation across the extensor tendon on the dorsal aspect of the foot.  Mild pain with resisted dorsiflexion of the foot.  Some pain with resisted dorsiflexion of the digits.  No pain at the metatarsophalangeal joint 1 through 5.  No deep intra-articular pain noted.  Pain on palpation talonavicular joint.  Mild pain with palpation with range of motion of the talonavicular joint.   Radiographs: 3 views of skeletally mature adult left foot: No fractures noted.  Also osteoarthritic changes noted at the TN joint with dorsal spurring.  No arthritic changes noted at the subtalar joint or the ankle joint. Assessment:   1. Extensor tendinitis of foot   2. Arthritis of foot, left    Plan:  Patient was evaluated and treated and all questions answered.  Left extensor tendinitis versus talonavicular joint arthritis -Patient pain  was not and is resolving while ambulating in boot.  She stopped wearing boot and has been improving.  She is noted some improvement.  At this time I believe patient will benefit from Tri-Lock ankle brace to transition into regular shoes.  I discussed with her that if the pain recurs and continues to cause issues we will get an MRI for possible surgical intervention.  She states understanding.  I will also hold off on steroid injection and if there is no improvement with if she comes  back to see me we discussed doing that as well. -Tri-Lock ankle brace was dispensed  No follow-ups on file.

## 2021-08-12 ENCOUNTER — Telehealth: Payer: Self-pay

## 2021-08-12 ENCOUNTER — Telehealth: Payer: Self-pay | Admitting: *Deleted

## 2021-08-12 NOTE — Telephone Encounter (Signed)
Received fax requesting refill on Eliquis.  Patient has not been seen in office since 06/02/2019.  Sent scheduling message that patient needs to come in office for labs/provider appt to continue refilling medication

## 2021-08-27 ENCOUNTER — Inpatient Hospital Stay: Payer: BC Managed Care – PPO

## 2021-08-27 ENCOUNTER — Inpatient Hospital Stay: Payer: BC Managed Care – PPO | Admitting: Family

## 2021-08-29 ENCOUNTER — Other Ambulatory Visit: Payer: Self-pay | Admitting: *Deleted

## 2021-08-29 MED ORDER — APIXABAN 2.5 MG PO TABS: 2.5000 mg | ORAL_TABLET | Freq: Two times a day (BID) | ORAL | 3 refills | Status: DC

## 2021-09-04 ENCOUNTER — Other Ambulatory Visit: Payer: Self-pay | Admitting: Family

## 2021-09-04 DIAGNOSIS — I82432 Acute embolism and thrombosis of left popliteal vein: Secondary | ICD-10-CM

## 2021-09-04 DIAGNOSIS — I2699 Other pulmonary embolism without acute cor pulmonale: Secondary | ICD-10-CM

## 2021-09-05 ENCOUNTER — Inpatient Hospital Stay: Payer: BC Managed Care – PPO | Attending: Hematology & Oncology

## 2021-09-05 ENCOUNTER — Inpatient Hospital Stay (HOSPITAL_BASED_OUTPATIENT_CLINIC_OR_DEPARTMENT_OTHER): Payer: BC Managed Care – PPO | Admitting: Family

## 2021-09-05 ENCOUNTER — Other Ambulatory Visit: Payer: Self-pay

## 2021-09-05 ENCOUNTER — Encounter: Payer: Self-pay | Admitting: Family

## 2021-09-05 VITALS — BP 143/92 | HR 72 | Temp 97.8°F | Resp 17 | Wt 228.8 lb

## 2021-09-05 DIAGNOSIS — R200 Anesthesia of skin: Secondary | ICD-10-CM | POA: Diagnosis not present

## 2021-09-05 DIAGNOSIS — I82432 Acute embolism and thrombosis of left popliteal vein: Secondary | ICD-10-CM

## 2021-09-05 DIAGNOSIS — Z7901 Long term (current) use of anticoagulants: Secondary | ICD-10-CM | POA: Diagnosis not present

## 2021-09-05 DIAGNOSIS — R202 Paresthesia of skin: Secondary | ICD-10-CM | POA: Diagnosis not present

## 2021-09-05 DIAGNOSIS — Z86718 Personal history of other venous thrombosis and embolism: Secondary | ICD-10-CM | POA: Diagnosis present

## 2021-09-05 DIAGNOSIS — Z79899 Other long term (current) drug therapy: Secondary | ICD-10-CM | POA: Diagnosis not present

## 2021-09-05 DIAGNOSIS — I2699 Other pulmonary embolism without acute cor pulmonale: Secondary | ICD-10-CM

## 2021-09-05 DIAGNOSIS — Z86711 Personal history of pulmonary embolism: Secondary | ICD-10-CM | POA: Insufficient documentation

## 2021-09-05 LAB — CMP (CANCER CENTER ONLY)
ALT: 12 U/L (ref 0–44)
AST: 24 U/L (ref 15–41)
Albumin: 3.7 g/dL (ref 3.5–5.0)
Alkaline Phosphatase: 61 U/L (ref 38–126)
Anion gap: 7 (ref 5–15)
BUN: 17 mg/dL (ref 6–20)
CO2: 26 mmol/L (ref 22–32)
Calcium: 8.7 mg/dL — ABNORMAL LOW (ref 8.9–10.3)
Chloride: 105 mmol/L (ref 98–111)
Creatinine: 0.88 mg/dL (ref 0.44–1.00)
GFR, Estimated: >60 mL/min (ref >60)
Glucose, Bld: 83 mg/dL (ref 70–99)
Potassium: 3.9 mmol/L (ref 3.5–5.1)
Sodium: 138 mmol/L (ref 135–145)
Total Bilirubin: 0.4 mg/dL (ref 0.3–1.2)
Total Protein: 6.8 g/dL (ref 6.5–8.1)

## 2021-09-05 LAB — CBC WITH DIFFERENTIAL (CANCER CENTER ONLY)
Abs Immature Granulocytes: 0.02 10*3/uL (ref 0.00–0.07)
Basophils Absolute: 0 10*3/uL (ref 0.0–0.1)
Basophils Relative: 0 %
Eosinophils Absolute: 0.1 10*3/uL (ref 0.0–0.5)
Eosinophils Relative: 1 %
HCT: 36.7 % (ref 36.0–46.0)
Hemoglobin: 12 g/dL (ref 12.0–15.0)
Immature Granulocytes: 0 %
Lymphocytes Relative: 21 %
Lymphs Abs: 1.8 10*3/uL (ref 0.7–4.0)
MCH: 29.7 pg (ref 26.0–34.0)
MCHC: 32.7 g/dL (ref 30.0–36.0)
MCV: 90.8 fL (ref 80.0–100.0)
Monocytes Absolute: 0.4 10*3/uL (ref 0.1–1.0)
Monocytes Relative: 5 %
Neutro Abs: 6.1 10*3/uL (ref 1.7–7.7)
Neutrophils Relative %: 73 %
Platelet Count: 266 10*3/uL (ref 150–400)
RBC: 4.04 MIL/uL (ref 3.87–5.11)
RDW: 13.9 % (ref 11.5–15.5)
WBC Count: 8.4 10*3/uL (ref 4.0–10.5)
nRBC: 0 % (ref 0.0–0.2)

## 2021-09-05 LAB — D-DIMER, QUANTITATIVE: D-Dimer, Quant: 0.49 ug/mL-FEU (ref 0.00–0.50)

## 2021-09-05 NOTE — Progress Notes (Signed)
Hematology and Oncology Follow Up Visit  Jenna Davenport [id number] April 10, 1971 50 y.o. 09/05/2021   Principle Diagnosis:  History of LLE DVT and LUL PTE, unprovoked -Previous patient of Dr. Caron Presume -10/2016: small LUL pulmonary embolus; doppler showed acute DVT involving left popliteal vein; on Eliquis since then -10/2018: hypercoag work-up negative for prothrombin gene mutation, Factor V Leiden and APLS   Current Treatment: 10/2016 - 01/2019: Eliquis 5mg  BID 01/2019 - present: Eliquis 2.5mg  BID for secondary ppx   Interim History:  Jenna Davenport is here today for follow-up. She was last here in 2020 and has been doing well.  She has been on maintenance Eliquis 2.5 mg PO BID since 01/2019 and has tolerated nicely.  No episodes of bleeding. No bruising or petechiae.  She states that the PE and DVT in 2017 was  her first and only thrombotic event. She states that she had been sedentary at home and then drove to 2018 and back prior to diagnosis. Her hyper coag work up was negative.  She does not smoke. No hormone replacement therapy.  No fever, chills, n/v, cough, rash, dizziness, SOB, chest pain, palpitations, abdominal pain or changes in bowel or bladder habits.  No swelling in her extremities at this time.  She has numbness and tingling in her left foot since a prior tendon surgery.   Pedal pulses are 2+.  No falls or syncope.  She has maintained a good appetite and is staying well hydrated. Her weight is stable at 228 lbs.   ECOG Performance Status: 1 - Symptomatic but completely ambulatory  Medications:  Allergies as of 09/05/2021       Reactions   Bee Pollen Other (See Comments), Swelling   Itchy, watery eyes    Pollen Extract Other (See Comments)   Itchy, watery eyes         Medication List        Accurate as of September 05, 2021  2:10 PM. If you have any questions, ask your nurse or doctor.          apixaban 2.5 MG Tabs tablet Commonly known as: Eliquis Take 1  tablet (2.5 mg total) by mouth 2 (two) times daily.   chlorthalidone 25 MG tablet Commonly known as: HYGROTON 1 tablet in the morning with food   cholecalciferol 25 MCG (1000 UNIT) tablet Commonly known as: VITAMIN D3 1 tablet   Hair Skin and Nails Formula Tabs Take 2 tablets by mouth daily.   meclizine 25 MG tablet Commonly known as: ANTIVERT Take 1 tablet (25 mg total) by mouth 3 (three) times daily as needed for dizziness.   sertraline 50 MG tablet Commonly known as: ZOLOFT Take 50 mg by mouth daily.   valACYclovir 1000 MG tablet Commonly known as: VALTREX Take 1,000 mg by mouth daily.        Allergies:  Allergies  Allergen Reactions   Bee Pollen Other (See Comments) and Swelling    Itchy, watery eyes    Pollen Extract Other (See Comments)    Itchy, watery eyes     Past Medical History, Surgical history, Social history, and Family History were reviewed and updated.  Review of Systems: All other 10 point review of systems is negative.   Physical Exam:  vitals were not taken for this visit.   Wt Readings from Last 3 Encounters:  04/23/20 249 lb (112.9 kg)  02/29/20 262 lb (118.8 kg)  08/16/19 250 lb (113.4 kg)    Ocular: Sclerae unicteric, pupils equal,  round and reactive to light Ear-nose-throat: Oropharynx clear, dentition fair Lymphatic: No cervical or supraclavicular adenopathy Lungs no rales or rhonchi, good excursion bilaterally Heart regular rate and rhythm, no murmur appreciated Abd soft, nontender, positive bowel sounds MSK no focal spinal tenderness, no joint edema Neuro: non-focal, well-oriented, appropriate affect Breasts: Deferred   Lab Results  Component Value Date   WBC 8.4 09/05/2021   HGB 12.0 09/05/2021   HCT 36.7 09/05/2021   MCV 90.8 09/05/2021   PLT 266 09/05/2021   No results found for: FERRITIN, IRON, TIBC, UIBC, IRONPCTSAT Lab Results  Component Value Date   RBC 4.04 09/05/2021   No results found for: KPAFRELGTCHN,  LAMBDASER, KAPLAMBRATIO No results found for: IGGSERUM, IGA, IGMSERUM No results found for: Marda Stalker, SPEI   Chemistry      Component Value Date/Time   NA 138 09/05/2021 1339   K 3.9 09/05/2021 1339   CL 105 09/05/2021 1339   CO2 26 09/05/2021 1339   BUN 17 09/05/2021 1339   CREATININE 0.88 09/05/2021 1339      Component Value Date/Time   CALCIUM 8.7 (L) 09/05/2021 1339   ALKPHOS 61 09/05/2021 1339   AST 24 09/05/2021 1339   ALT 12 09/05/2021 1339   BILITOT 0.4 09/05/2021 1339       Impression and Plan: Jenna Davenport is a very pleasant 50 yo female with history of LLE DVT and LUL PE diagnosed 10/2016. She has been on Eliquis since that time. Hyper coag work up in 2019 was negative. She had been sedentary and then drove to [place] and back at time of diagnosis and feels they may have influenced her thrombotic event.  I spoke with Dr. Myna Hidalgo and she will complete this month of Eliquis and then stop.  She will start taking 1 baby aspirin daily with food.  We will follow-up as needed.  She can contact our office with any questions or concerns.   Emeline Gins, NP 9/8/20222:10 PM

## 2022-01-16 ENCOUNTER — Telehealth: Payer: Self-pay | Admitting: *Deleted

## 2022-01-16 ENCOUNTER — Other Ambulatory Visit: Payer: Self-pay | Admitting: Family

## 2022-01-16 DIAGNOSIS — I82432 Acute embolism and thrombosis of left popliteal vein: Secondary | ICD-10-CM

## 2022-01-16 NOTE — Telephone Encounter (Signed)
Per secure chat - per Maralyn Sago - gave upcoming appointments - confirmed

## 2022-01-16 NOTE — Telephone Encounter (Signed)
Call received from patient stating that she would like to come in for labs and to see [name] prior to an upcoming scheduled surgery.  Vale Haven NP notified and message sent to scheduling.

## 2022-01-21 ENCOUNTER — Inpatient Hospital Stay: Payer: BC Managed Care – PPO | Admitting: Family

## 2022-01-21 ENCOUNTER — Other Ambulatory Visit: Payer: Self-pay

## 2022-01-21 ENCOUNTER — Inpatient Hospital Stay: Payer: BC Managed Care – PPO | Attending: Hematology & Oncology

## 2022-01-21 ENCOUNTER — Encounter: Payer: Self-pay | Admitting: Family

## 2022-01-21 VITALS — BP 126/81 | HR 71 | Temp 98.0°F | Resp 16 | Ht 66.0 in | Wt 226.0 lb

## 2022-01-21 DIAGNOSIS — Z86718 Personal history of other venous thrombosis and embolism: Secondary | ICD-10-CM | POA: Diagnosis present

## 2022-01-21 DIAGNOSIS — I2699 Other pulmonary embolism without acute cor pulmonale: Secondary | ICD-10-CM

## 2022-01-21 DIAGNOSIS — Z299 Encounter for prophylactic measures, unspecified: Secondary | ICD-10-CM | POA: Diagnosis not present

## 2022-01-21 DIAGNOSIS — I82432 Acute embolism and thrombosis of left popliteal vein: Secondary | ICD-10-CM

## 2022-01-21 DIAGNOSIS — Z7901 Long term (current) use of anticoagulants: Secondary | ICD-10-CM | POA: Diagnosis not present

## 2022-01-21 DIAGNOSIS — Z86711 Personal history of pulmonary embolism: Secondary | ICD-10-CM | POA: Insufficient documentation

## 2022-01-21 DIAGNOSIS — Z7982 Long term (current) use of aspirin: Secondary | ICD-10-CM | POA: Diagnosis not present

## 2022-01-21 LAB — CBC WITH DIFFERENTIAL (CANCER CENTER ONLY)
Abs Immature Granulocytes: 0.01 10*3/uL (ref 0.00–0.07)
Basophils Absolute: 0 10*3/uL (ref 0.0–0.1)
Basophils Relative: 0 %
Eosinophils Absolute: 0.1 10*3/uL (ref 0.0–0.5)
Eosinophils Relative: 2 %
HCT: 39.4 % (ref 36.0–46.0)
Hemoglobin: 12.9 g/dL (ref 12.0–15.0)
Immature Granulocytes: 0 %
Lymphocytes Relative: 33 %
Lymphs Abs: 1.6 10*3/uL (ref 0.7–4.0)
MCH: 29.5 pg (ref 26.0–34.0)
MCHC: 32.7 g/dL (ref 30.0–36.0)
MCV: 90.2 fL (ref 80.0–100.0)
Monocytes Absolute: 0.4 10*3/uL (ref 0.1–1.0)
Monocytes Relative: 8 %
Neutro Abs: 2.9 10*3/uL (ref 1.7–7.7)
Neutrophils Relative %: 57 %
Platelet Count: 291 10*3/uL (ref 150–400)
RBC: 4.37 MIL/uL (ref 3.87–5.11)
RDW: 14.2 % (ref 11.5–15.5)
WBC Count: 5 10*3/uL (ref 4.0–10.5)
nRBC: 0 % (ref 0.0–0.2)

## 2022-01-21 LAB — CMP (CANCER CENTER ONLY)
ALT: 11 U/L (ref 0–44)
AST: 27 U/L (ref 15–41)
Albumin: 3.8 g/dL (ref 3.5–5.0)
Alkaline Phosphatase: 75 U/L (ref 38–126)
Anion gap: 6 (ref 5–15)
BUN: 14 mg/dL (ref 6–20)
CO2: 32 mmol/L (ref 22–32)
Calcium: 9.3 mg/dL (ref 8.9–10.3)
Chloride: 102 mmol/L (ref 98–111)
Creatinine: 1.02 mg/dL — ABNORMAL HIGH (ref 0.44–1.00)
GFR, Estimated: >60 mL/min (ref >60)
Glucose, Bld: 98 mg/dL (ref 70–99)
Potassium: 3.3 mmol/L — ABNORMAL LOW (ref 3.5–5.1)
Sodium: 140 mmol/L (ref 135–145)
Total Bilirubin: 0.6 mg/dL (ref 0.3–1.2)
Total Protein: 7.2 g/dL (ref 6.5–8.1)

## 2022-01-21 LAB — PROTIME-INR
INR: 1 (ref 0.8–1.2)
Prothrombin Time: 13.5 seconds (ref 11.4–15.2)

## 2022-01-21 LAB — APTT: aPTT: 24 seconds (ref 24–36)

## 2022-01-21 MED ORDER — APIXABAN 2.5 MG PO TABS: 2.5000 mg | ORAL_TABLET | Freq: Two times a day (BID) | ORAL | 1 refills | Status: DC

## 2022-01-21 NOTE — Progress Notes (Signed)
Hematology and Oncology Follow Up Visit  Jenna Davenport [id number] 04-18-1971 51 y.o. 01/21/2022   Principle Diagnosis:  History of LLE DVT and LUL PTE, unprovoked -Previous patient of Dr. Caron Presume -10/2016: small LUL pulmonary embolus; doppler showed acute DVT involving left popliteal vein; on Eliquis since then -10/2018: hypercoag work-up negative for prothrombin gene mutation, Factor V Leiden and APLS   Past Treatment: 10/2016 - 01/2019: Eliquis 5mg  BID 01/2019 - present: Eliquis 2.5mg  BID for secondary ppx - completed October 2022  Current Therapy: EC Aspirin 81 mg daily   Interim History:  Jenna Davenport is here today for follow-up. She had a fall in her kitchen this past Sunday night. She denies syncope.  Her lower back and left hip are still sore. She wants to wait a couple more days to see if she has improvement before going to urgent care for X-rays.  PT/INR and aPTT are within normal limites.  She will be having a breast reduction and excess skin removal later this year.  She has not had any swelling, numbness or tingling in her extremities.  Pedal pulses are 2+.  She has maintained a good appetite and is staying well hydrated. Her weight is stable at 226 lbs.   ECOG Performance Status: 1 - Symptomatic but completely ambulatory  Medications:  Allergies as of 01/21/2022       Reactions   Bee Pollen Other (See Comments), Swelling   Itchy, watery eyes    Pollen Extract Other (See Comments)   Itchy, watery eyes         Medication List        Accurate as of January 21, 2022 10:30 AM. If you have any questions, ask your nurse or doctor.          cholecalciferol 25 MCG (1000 UNIT) tablet Commonly known as: VITAMIN D3 1 tablet   Hair Skin and Nails Formula Tabs Take 2 tablets by mouth daily.   sertraline 50 MG tablet Commonly known as: ZOLOFT Take 50 mg by mouth daily.   valACYclovir 1000 MG tablet Commonly known as: VALTREX Take 1,000 mg by mouth daily.         Allergies:  Allergies  Allergen Reactions   Bee Pollen Other (See Comments) and Swelling    Itchy, watery eyes    Pollen Extract Other (See Comments)    Itchy, watery eyes     Past Medical History, Surgical history, Social history, and Family History were reviewed and updated.  Review of Systems: All other 10 point review of systems is negative.   Physical Exam:  height is 5\' 6"  (1.676 m) and weight is 226 lb (102.5 kg). Her temperature is 98 F (36.7 C). Her blood pressure is 126/81 and her pulse is 71. Her respiration is 16 and oxygen saturation is 100%.   Wt Readings from Last 3 Encounters:  01/21/22 226 lb (102.5 kg)  09/05/21 228 lb 12.8 oz (103.8 kg)  04/23/20 249 lb (112.9 kg)    Ocular: Sclerae unicteric, pupils equal, round and reactive to light Ear-nose-throat: Oropharynx clear, dentition fair Lymphatic: No cervical or supraclavicular adenopathy Lungs no rales or rhonchi, good excursion bilaterally Heart regular rate and rhythm, no murmur appreciated Abd soft, nontender, positive bowel sounds MSK no focal spinal tenderness, no joint edema Neuro: non-focal, well-oriented, appropriate affect Breasts: Deferred   Lab Results  Component Value Date   WBC 5.0 01/21/2022   HGB 12.9 01/21/2022   HCT 39.4 01/21/2022   MCV 90.2 01/21/2022  PLT 291 01/21/2022   No results found for: FERRITIN, IRON, TIBC, UIBC, IRONPCTSAT Lab Results  Component Value Date   RBC 4.37 01/21/2022   No results found for: KPAFRELGTCHN, LAMBDASER, KAPLAMBRATIO No results found for: IGGSERUM, IGA, IGMSERUM No results found for: Marda Stalker, SPEI   Chemistry      Component Value Date/Time   NA 138 09/05/2021 1339   K 3.9 09/05/2021 1339   CL 105 09/05/2021 1339   CO2 26 09/05/2021 1339   BUN 17 09/05/2021 1339   CREATININE 0.88 09/05/2021 1339      Component Value Date/Time   CALCIUM 8.7 (L) 09/05/2021 1339   ALKPHOS  61 09/05/2021 1339   AST 24 09/05/2021 1339   ALT 12 09/05/2021 1339   BILITOT 0.4 09/05/2021 1339       Impression and Plan: Jenna Davenport is a very pleasant 51 yo female with history of LLE DVT and LUL PE diagnosed 10/2016. She has been on Eliquis since that time. Hyper coag work up in 2019 was negative. She had been sedentary and then drove to [place] and back at time of diagnosis and feels they may have influenced her thrombotic event.  She is recuperating from her recent fall. So far there has been no evidence of recurrent thrombotic event.  She will be having breast reduction and excess skin removal surgery on 02/06/2022 with Dr. Deforest Hoyles. I did contact their office and have let them know our recommendation that the patient start Eliquis 2.5 mg PO BID 7 days prior to surgery (01/30/2022), stop 2 days prior to surgery (02/04/2022) and then restart the day after surgery (02/07/2022) for 6 weeks as DVT/PE prophylaxis. Patient notified and verbalized understanding.  She can restart her EC baby aspirin once she has completed her prophylaxis.   Follow-up in 6 months.   Eileen Stanford, NP 1/24/202310:30 AM

## 2022-06-03 ENCOUNTER — Other Ambulatory Visit: Payer: Self-pay

## 2022-06-03 ENCOUNTER — Inpatient Hospital Stay: Payer: BC Managed Care – PPO | Attending: Hematology & Oncology | Admitting: Family

## 2022-06-03 ENCOUNTER — Encounter: Payer: Self-pay | Admitting: Family

## 2022-06-03 VITALS — BP 120/78 | HR 90 | Temp 98.2°F | Resp 18 | Ht 66.0 in | Wt 222.4 lb

## 2022-06-03 DIAGNOSIS — I2699 Other pulmonary embolism without acute cor pulmonale: Secondary | ICD-10-CM

## 2022-06-03 DIAGNOSIS — Z7901 Long term (current) use of anticoagulants: Secondary | ICD-10-CM | POA: Diagnosis not present

## 2022-06-03 DIAGNOSIS — I82432 Acute embolism and thrombosis of left popliteal vein: Secondary | ICD-10-CM

## 2022-06-03 DIAGNOSIS — Z86711 Personal history of pulmonary embolism: Secondary | ICD-10-CM | POA: Diagnosis not present

## 2022-06-03 DIAGNOSIS — Z79899 Other long term (current) drug therapy: Secondary | ICD-10-CM | POA: Insufficient documentation

## 2022-06-03 DIAGNOSIS — Z7982 Long term (current) use of aspirin: Secondary | ICD-10-CM | POA: Insufficient documentation

## 2022-06-03 DIAGNOSIS — Z86718 Personal history of other venous thrombosis and embolism: Secondary | ICD-10-CM | POA: Insufficient documentation

## 2022-06-03 NOTE — Progress Notes (Signed)
Hematology and Oncology Follow Up Visit  Jenna Davenport [id number] 05/04/1971 51 y.o. 06/03/2022   Principle Diagnosis:  History of LLE DVT and LUL PTE, unprovoked -Previous patient of Dr. Caron Presume -10/2016: small LUL pulmonary embolus; doppler showed acute DVT involving left popliteal vein; on Eliquis since then -10/2018: hypercoag work-up negative for prothrombin gene mutation, Factor V Leiden and APLS   Past Treatment: 10/2016 - 01/2019: Eliquis 5mg  BID 01/2019 - present: Eliquis 2.5mg  BID for secondary ppx - completed October 2022   Current Therapy: EC Aspirin 81 mg daily   Interim History:  Ms. Jenna Davenport is here today for follow-up. She did not want labs today.   She is doing well and has no complaints at this time.  He breast reduction and skin removal surgery went well earlier this year. She is very happy with the results. She is hoping to have a tummy tuck this fall and will let Jarold Motto know several weeks prior to so we can set her up for DVT prophylaxis.  She will restart her daily aspirin.  No fever, chills, n/v, cough, rash, dizziness, SOB, chest pain, palpitations, abdominal pain or changes in bowel or bladder habits.  No blood loss noted. No bruising or petechiae.  No swelling, tenderness, numbness or tingling in her extremities at this time.  She has a dime sized bump on her left inner thigh that feels like a valve or possibly phlebitis. She denies pain. There is no redness or swelling. Pedal pulses are 2+. She has been sleeping on her right side and thinks she may need to sleep with a pillow between her knees to help prevent this.  No falls or syncope.  Appetite and hydration are good. Her weight is stable at 222 lbs.   ECOG Performance Status: 1 - Symptomatic but completely ambulatory  Medications:  Allergies as of 06/03/2022       Reactions   Bee Pollen Other (See Comments), Swelling   Itchy, watery eyes    Pollen Extract Other (See Comments)   Itchy, watery eyes          Medication List        Accurate as of June 03, 2022 10:08 AM. If you have any questions, ask your nurse or doctor.          apixaban 2.5 MG Tabs tablet Commonly known as: ELIQUIS Take 1 tablet (2.5 mg total) by mouth 2 (two) times daily. Start 7 days prior to surgery and stop 2 days prior to surgery. Restart the day after surgery for 6 weeks then stop.   cholecalciferol 25 MCG (1000 UNIT) tablet Commonly known as: VITAMIN D3 1 tablet   Hair Skin and Nails Formula Tabs Take 2 tablets by mouth daily.   sertraline 50 MG tablet Commonly known as: ZOLOFT Take 50 mg by mouth daily.   valACYclovir 1000 MG tablet Commonly known as: VALTREX Take 1,000 mg by mouth daily.        Allergies:  Allergies  Allergen Reactions   Bee Pollen Other (See Comments) and Swelling    Itchy, watery eyes    Pollen Extract Other (See Comments)    Itchy, watery eyes     Past Medical History, Surgical history, Social history, and Family History were reviewed and updated.  Review of Systems: All other 10 point review of systems is negative.   Physical Exam:  vitals were not taken for this visit.   Wt Readings from Last 3 Encounters:  01/21/22 226 lb (102.5 kg)  09/05/21 228 lb 12.8 oz (103.8 kg)  04/23/20 249 lb (112.9 kg)    Ocular: Sclerae unicteric, pupils equal, round and reactive to light Ear-nose-throat: Oropharynx clear, dentition fair Lymphatic: No cervical or supraclavicular adenopathy Lungs no rales or rhonchi, good excursion bilaterally Heart regular rate and rhythm, no murmur appreciated Abd soft, nontender, positive bowel sounds MSK no focal spinal tenderness, no joint edema Neuro: non-focal, well-oriented, appropriate affect Breasts: Deferred   Lab Results  Component Value Date   WBC 5.0 01/21/2022   HGB 12.9 01/21/2022   HCT 39.4 01/21/2022   MCV 90.2 01/21/2022   PLT 291 01/21/2022   No results found for: FERRITIN, IRON, TIBC, UIBC, IRONPCTSAT Lab  Results  Component Value Date   RBC 4.37 01/21/2022   No results found for: KPAFRELGTCHN, LAMBDASER, KAPLAMBRATIO No results found for: IGGSERUM, IGA, IGMSERUM No results found for: Jenna Davenport, SPEI   Chemistry      Component Value Date/Time   NA 140 01/21/2022 1002   K 3.3 (L) 01/21/2022 1002   CL 102 01/21/2022 1002   CO2 32 01/21/2022 1002   BUN 14 01/21/2022 1002   CREATININE 1.02 (H) 01/21/2022 1002      Component Value Date/Time   CALCIUM 9.3 01/21/2022 1002   ALKPHOS 75 01/21/2022 1002   AST 27 01/21/2022 1002   ALT 11 01/21/2022 1002   BILITOT 0.6 01/21/2022 1002       Impression and Plan: Ms. Jenna Davenport is a very pleasant 51 yo female with history of LLE DVT and LUL PE diagnosed 10/2016. She has been on Eliquis since that time. Hyper coag work up in 2019 was negative. She had been sedentary and then drove to [place] and back at time of diagnosis and feels they may have influenced her thrombotic event.  She completed 3 years of full dose Eliquis as well as 2 years of maintenance in 2022.  So far, she has done well there has been no new recurrence. She does need DVT prophylaxis prior to and right after any surgery. Patient verbalized understanding and will let us know when she schedules her tummy tuck.  Continue daily aspirin.  Follow-up in 1 year.  She can contact our office with any questions or concerns. We can certainly see her sooner if needed.   Eileen Stanford, NP 6/6/202310:08 AM

## 2022-07-25 ENCOUNTER — Inpatient Hospital Stay: Payer: BC Managed Care – PPO

## 2022-07-25 ENCOUNTER — Ambulatory Visit: Payer: BC Managed Care – PPO | Admitting: Family

## 2022-11-13 ENCOUNTER — Other Ambulatory Visit: Payer: Self-pay | Admitting: *Deleted

## 2022-11-13 DIAGNOSIS — I8393 Asymptomatic varicose veins of bilateral lower extremities: Secondary | ICD-10-CM

## 2022-12-02 ENCOUNTER — Encounter: Payer: Self-pay | Admitting: Vascular Surgery

## 2022-12-02 ENCOUNTER — Ambulatory Visit (INDEPENDENT_AMBULATORY_CARE_PROVIDER_SITE_OTHER): Payer: BC Managed Care – PPO | Admitting: Vascular Surgery

## 2022-12-02 ENCOUNTER — Ambulatory Visit (HOSPITAL_COMMUNITY)
Admission: RE | Admit: 2022-12-02 | Discharge: 2022-12-02 | Disposition: A | Discharge status: Home / Self Care | Payer: BC Managed Care – PPO | Source: Ambulatory Visit | Attending: Vascular Surgery | Admitting: Vascular Surgery

## 2022-12-02 VITALS — BP 127/78 | HR 94 | Temp 98.0°F | Resp 20 | Ht 66.0 in | Wt 220.0 lb

## 2022-12-02 DIAGNOSIS — I872 Venous insufficiency (chronic) (peripheral): Secondary | ICD-10-CM

## 2022-12-02 DIAGNOSIS — I8393 Asymptomatic varicose veins of bilateral lower extremities: Secondary | ICD-10-CM | POA: Diagnosis present

## 2022-12-02 NOTE — Progress Notes (Signed)
[facility] [place]  ASSESSMENT / PLAN: Jenna Davenport is a 51 y.o. female with chronic venous insufficiency of left lower extremity causing swelling (C3 disease).  Venous duplex is significant for deep, AASV and GSV reflux Recommend compression and elevation for symptomatic relief. Follow up in three months to discuss interventional options for CVI   CHIEF COMPLAINT: left leg swelling and pain  HISTORY OF PRESENT ILLNESS: Jenna Davenport is a 51 y.o. female who presents to clinic for evaluation of left lower extremity swelling discomfort.  The patient has a history of deep venous thrombosis in 2017 complicated by pulmonary embolism.  Over the past month she is noticed swelling and discomfort in her left lower extremity fairly typical of chronic venous insufficiency.    Past Medical History:  Diagnosis Date   Anemia    Pulmonary embolism [facility])     Past Surgical History:  Procedure Laterality Date   CHOLECYSTECTOMY     2007   FOOT TENDON SURGERY Left 11/2017   GASTRIC BYPASS     2011   TUBAL LIGATION     2008    Family History  Problem Relation Age of Onset   Seizures Mother    Stroke Mother    Heart failure Mother    Hyperlipidemia Mother    Hypertension Father    Diabetes Father    Seizures Father    Hyperlipidemia Father    Colon cancer Neg Hx     Social History   Socioeconomic History   Marital status: Divorced    Spouse name: Not on file   Number of children: Not on file   Years of education: Not on file   Highest education level: Not on file  Occupational History   Not on file  Tobacco Use   Smoking status: Never   Smokeless tobacco: Never  Vaping Use   Vaping Use: Never used  Substance and Sexual Activity   Alcohol use: Yes    Alcohol/week: 5.0 standard drinks of alcohol    Types: 5 Standard drinks or equivalent per week    Comment: occasionally   Drug use: No   Sexual activity: Not on file  Other Topics Concern   Not on  file  Social History Narrative   Not on file   Social Determinants of Health   Financial Resource Strain: Not on file  Food Insecurity: Not on file  Transportation Needs: Not on file  Physical Activity: Not on file  Stress: Not on file  Social Connections: Not on file  Intimate Partner Violence: Not on file    Allergies  Allergen Reactions   Bee Pollen Itching and Swelling     watery eyes    Pollen Extract Other (See Comments)    Itchy, watery eyes     Current Outpatient Medications  Medication Sig Dispense Refill   chlorthalidone (HYGROTON) 25 MG tablet Take by mouth.     phentermine (ADIPEX-P) 37.5 MG tablet Take by mouth.     sertraline (ZOLOFT) 50 MG tablet Take 50 mg by mouth daily.     UNABLE TO FIND Take 2 capsules by mouth daily. Med Name: Goli Gummies (blue gummies are stress reliever. Red gummies are for weight loss.)     valACYclovir (VALTREX) 1000 MG tablet Take 1,000 mg by mouth daily.     No current facility-administered medications for this visit.    PHYSICAL EXAM Vitals:   12/02/22 1424  BP: 127/78  Pulse: 94  Resp: 20  Temp:  98 F (36.7 C)  SpO2: 99%  Weight: 220 lb (99.8 kg)  Height: 5\' 6"  (1.676 m)   Well appearing woman in no distress Regular rate and rhythm Unlabored breathing Prominent varicosities in left lateral thigh in AASV distribution 2+ DP pulses  PERTINENT LABORATORY AND RADIOLOGIC DATA  Most recent CBC    Latest Ref Rng & Units 01/21/2022   10:02 AM 09/05/2021    1:39 PM 06/02/2019    3:18 PM  CBC  WBC 4.0 - 10.5 K/uL 5.0  8.4  6.2   Hemoglobin 12.0 - 15.0 g/dL 08/02/2019  16.1  09.6   Hematocrit 36.0 - 46.0 % 39.4  36.7  39.6   Platelets 150 - 400 K/uL 291  266  294      Most recent CMP    Latest Ref Rng & Units 01/21/2022   10:02 AM 09/05/2021    1:39 PM 06/02/2019    3:18 PM  CMP  Glucose 70 - 99 mg/dL 98  83  98   BUN 6 - 20 mg/dL 14  17  11    Creatinine 0.44 - 1.00 mg/dL 08/02/2019   4.09   Sodium 135 - 145 mmol/L 140   138  136   Potassium 3.5 - 5.1 mmol/L 3.3  3.9  4.1   Chloride 98 - 111 mmol/L 102  105  104   CO2 22 - 32 mmol/L 32  26  24   Calcium 8.9 - 10.3 mg/dL 9.3  8.7  8.6   Total Protein 6.5 - 8.1 g/dL 7.2  6.8  7.3   Total Bilirubin 0.3 - 1.2 mg/dL 0.6  0.4  0.3   Alkaline Phos 38 - 126 U/L 75  61  74   AST 15 - 41 U/L 27  24  24    ALT 0 - 44 U/L 11  12  12     Left venous reflux study Shows deep, AASV, and GSV reflux to SFJ  8.11. 9.14, MD [facility] Phone Number: [phone] 12/02/2022 2:48 PM   Total time spent on preparing this encounter including chart review, data review, collecting history, examining the patient, coordinating care for this new patient, 45 minutes.  Portions of this report may have been transcribed using voice recognition software.  Every effort has been made to ensure accuracy; however, inadvertent computerized transcription errors may still be present.

## 2023-02-13 ENCOUNTER — Ambulatory Visit
Admission: EM | Admit: 2023-02-13 | Discharge: 2023-02-13 | Disposition: A | Discharge status: Home / Self Care | Payer: BC Managed Care – PPO

## 2023-02-13 DIAGNOSIS — H1131 Conjunctival hemorrhage, right eye: Secondary | ICD-10-CM | POA: Diagnosis not present

## 2023-02-13 NOTE — ED Provider Notes (Signed)
[facility]    CSN: [id number] Arrival date & time: 02/13/23  1134      History   Chief Complaint Chief Complaint  Patient presents with   Eye Problem    HPI Jenna Davenport is a 52 y.o. female.   Patient presents with redness of the right eye that started upon awakening this morning.  She denies any trauma or foreign body to the eye.  She does report that she wears contact lenses but took them out last night prior to symptoms starting.  Denies any vision changes.  Patient states that she used to take blood thinning medications but has been off them for quite some time.   Eye Problem   Past Medical History:  Diagnosis Date   Anemia    Pulmonary embolism [facility])     Patient Active Problem List   Diagnosis Date Noted   Acute embolism and thrombosis of unspecified deep veins of left lower extremity ([place]) 05/10/2021   Amenorrhea 05/10/2021   Anxiety about health 05/10/2021   Excessive and frequent menstruation with irregular cycle 05/10/2021   Family history of ischemic heart disease (IHD) 05/10/2021   Genital herpes simplex 05/10/2021   History of DVT (deep vein thrombosis) 05/10/2021   Personal history of pulmonary embolism 05/10/2021   Varicose veins of lower extremity 05/10/2021   Postinflammatory hyperpigmentation 06/01/2019   Cervical high risk HPV (human papillomavirus) test positive 06/24/2017   Acute deep vein thrombosis (DVT) of popliteal vein of left lower extremity ([place]) 11/11/2016   Essential hypertension 11/11/2016   Tachycardia 11/09/2016   Dyspnea on effort 11/09/2016   Rash 11/09/2016   Hematuria 11/09/2016   Iron deficiency anemia 11/09/2016   Acute pulmonary embolism ([place]) 11/08/2016   Obesity 11/08/2016   Acute right flank pain 11/08/2016    Past Surgical History:  Procedure Laterality Date   CHOLECYSTECTOMY     2007   FOOT TENDON SURGERY Left 11/2017   GASTRIC BYPASS     2011   TUBAL LIGATION     2008    OB History   No  obstetric history on file.      Home Medications    Prior to Admission medications   Medication Sig Start Date End Date Taking? Authorizing Provider  chlorthalidone (HYGROTON) 25 MG tablet Take by mouth. 07/26/20   [provider]  phentermine (ADIPEX-P) 37.5 MG tablet Take by mouth. 10/31/22   [provider]  sertraline (ZOLOFT) 50 MG tablet Take 50 mg by mouth daily.    [provider]  UNABLE TO FIND Take 2 capsules by mouth daily. Med Name: Goli Gummies (blue gummies are stress reliever. Red gummies are for weight loss.)    [provider]  valACYclovir (VALTREX) 1000 MG tablet Take 1,000 mg by mouth daily.    [provider]    Family History Family History  Problem Relation Age of Onset   Seizures Mother    Stroke Mother    Heart failure Mother    Hyperlipidemia Mother    Hypertension Father    Diabetes Father    Seizures Father    Hyperlipidemia Father    Colon cancer Neg Hx     Social History Social History   Tobacco Use   Smoking status: Never   Smokeless tobacco: Never  Vaping Use   Vaping Use: Never used  Substance Use Topics   Alcohol use: Yes    Alcohol/week: 5.0 standard drinks of alcohol    Types: 5 Standard  drinks or equivalent per week    Comment: occasionally   Drug use: No     Allergies   Bee pollen and Pollen extract   Review of Systems Review of Systems Per HPI  Physical Exam Triage Vital Signs ED Triage Vitals  Enc Vitals Group     BP 02/13/23 1200 124/85     Pulse Rate 02/13/23 1200 86     Resp --      Temp 02/13/23 1200 98.3 F (36.8 C)     Temp Source 02/13/23 1200 Oral     SpO2 02/13/23 1200 96 %     Weight --      Height --      Head Circumference --      Peak Flow --      Pain Score 02/13/23 1159 0     Pain Loc --      Pain Edu? --      Excl. in [place]? --    No data found.  Updated Vital Signs BP 124/85 (BP Location: Left Arm)   Pulse 86   Temp 98.3 F (36.8 C) (Oral)    SpO2 96%   Visual Acuity Right Eye Distance:   Left Eye Distance:   Bilateral Distance:    Right Eye Near:   Left Eye Near:    Bilateral Near:     Physical Exam Constitutional:      General: She is not in acute distress.    Appearance: Normal appearance. She is not toxic-appearing or diaphoretic.  HENT:     Head: Normocephalic and atraumatic.  Eyes:     General: Lids are normal. Lids are everted, no foreign bodies appreciated. Vision grossly intact. Gaze aligned appropriately.     Extraocular Movements: Extraocular movements intact.     Conjunctiva/sclera: Conjunctivae normal.      Comments: Patient has subconjunctival hemorrhage present to left side of sclera of right eye.  Pulmonary:     Effort: Pulmonary effort is normal.  Neurological:     General: No focal deficit present.     Mental Status: She is alert and oriented to person, place, and time. Mental status is at baseline.  Psychiatric:        Mood and Affect: Mood normal.        Behavior: Behavior normal.        Thought Content: Thought content normal.        Judgment: Judgment normal.      UC Treatments / Results  Labs (all labs ordered are listed, but only abnormal results are displayed) Labs Reviewed - No data to display  EKG   Radiology No results found.  Procedures Procedures (including critical care time)  Medications Ordered in UC Medications - No data to display  Initial Impression / Assessment and Plan / UC Course  I have reviewed the triage vital signs and the nursing notes.  Pertinent labs & imaging results that were available during my care of the patient were reviewed by me and considered in my medical decision making (see chart for details).     Physical exam is consistent with subconjunctival hemorrhage of right eye.  Advised patient that this is benign and will reabsorb on its own in about 1 to 2 weeks.  Given no trauma or foreign body, fluorescein stain was deferred.  Advised  patient to keep her contact lenses out.  Advised strict follow-up with ophthalmology if symptoms persist or worsen.  Patient verbalized understanding and was agreeable with plan.  Final Clinical Impressions(s) / UC Diagnoses   Final diagnoses:  Subconjunctival hemorrhage of right eye     Discharge Instructions      You have a subconjunctival hemorrhage which is a ruptured blood vessel in your eye.  This is benign and will reabsorb in about 1 to 2 weeks.  Follow-up with your eye doctor if you have any further concerns or if you develop eye pain, blurry vision, or if it persists.    ED Prescriptions   None    PDMP not reviewed this encounter.   Teodora Medici, [place] 02/13/23 [phone]

## 2023-02-13 NOTE — Discharge Instructions (Signed)
You have a subconjunctival hemorrhage which is a ruptured blood vessel in your eye.  This is benign and will reabsorb in about 1 to 2 weeks.  Follow-up with your eye doctor if you have any further concerns or if you develop eye pain, blurry vision, or if it persists.

## 2023-02-13 NOTE — ED Triage Notes (Signed)
Pt presents with blood spot on right eye since waking up this morning.

## 2023-03-11 ENCOUNTER — Encounter: Payer: Self-pay | Admitting: Vascular Surgery

## 2023-03-11 ENCOUNTER — Ambulatory Visit (INDEPENDENT_AMBULATORY_CARE_PROVIDER_SITE_OTHER): Payer: BC Managed Care – PPO | Admitting: Vascular Surgery

## 2023-03-11 VITALS — BP 124/83 | HR 93 | Temp 97.9°F | Resp 20 | Ht 66.0 in | Wt 217.0 lb

## 2023-03-11 DIAGNOSIS — I872 Venous insufficiency (chronic) (peripheral): Secondary | ICD-10-CM

## 2023-03-11 NOTE — Progress Notes (Signed)
Patient ID: Jenna Davenport, female   DOB: March 15, 1971, 51 y.o.   MRN: [id number]  Reason for Consult: Follow-up   Referred by Christa See, FNP  Subjective:     HPI:  Jenna Davenport is a 52 y.o. female with a history of DVT and PE in 2017 no longer on anticoagulation.  She has been worked up for venous insufficiency in the past but was denied secondary to insurance.  She now has painful varicosities along the anterior left thigh and the posterior left thigh and leg and also has swelling of the left leg relative to the right.  She states that at the end of the day the swelling hurts worse causing her significant discomfort and aching.  She denies any previous venous intervention.  Past Medical History:  Diagnosis Date   Anemia    Pulmonary embolism ([place])    Family History  Problem Relation Age of Onset   Seizures Mother    Stroke Mother    Heart failure Mother    Hyperlipidemia Mother    Hypertension Father    Diabetes Father    Seizures Father    Hyperlipidemia Father    Colon cancer Neg Hx    Past Surgical History:  Procedure Laterality Date   CHOLECYSTECTOMY     2007   FOOT TENDON SURGERY Left 11/2017   GASTRIC BYPASS     2011   TOTAL HIP ARTHROPLASTY Left    TUBAL LIGATION     2008    Short Social History:  Social History   Tobacco Use   Smoking status: Never   Smokeless tobacco: Never  Substance Use Topics   Alcohol use: Yes    Alcohol/week: 5.0 standard drinks of alcohol    Types: 5 Standard drinks or equivalent per week    Comment: occasionally    Allergies  Allergen Reactions   Bee Pollen Itching and Swelling     watery eyes    Pollen Extract Other (See Comments)    Itchy, watery eyes     Current Outpatient Medications  Medication Sig Dispense Refill   chlorthalidone (HYGROTON) 25 MG tablet Take by mouth.     phentermine (ADIPEX-P) 37.5 MG tablet Take by mouth.     sertraline (ZOLOFT) 50 MG tablet Take 50 mg by mouth daily.     UNABLE TO FIND  Take 2 capsules by mouth daily. Med Name: Goli Gummies (blue gummies are stress reliever. Red gummies are for weight loss.)     valACYclovir (VALTREX) 1000 MG tablet Take 1,000 mg by mouth daily.     No current facility-administered medications for this visit.    Review of Systems  Constitutional:  Constitutional negative. HENT: HENT negative.  Eyes: Eyes negative.  Cardiovascular: Positive for leg swelling.  Musculoskeletal: Positive for leg pain.  Neurological: Neurological negative. Hematologic: Hematologic/lymphatic negative.  Psychiatric: Psychiatric negative.        Objective:  Objective  Vitals:   03/11/23 1544  BP: 124/83  Pulse: 93  Resp: 20  Temp: 97.9 F (36.6 C)  SpO2: 98%     Physical Exam HENT:     Head: Normocephalic.     Nose: Nose normal.  Eyes:     Pupils: Pupils are equal, round, and reactive to light.  Cardiovascular:     Rate and Rhythm: Normal rate.  Pulmonary:     Effort: Pulmonary effort is normal.  Abdominal:     General: Abdomen is flat.     Palpations: Abdomen is soft.  Musculoskeletal:     Cervical back: Normal range of motion and neck supple.     Right lower leg: No edema.     Left lower leg: Edema present.  Skin:    General: Skin is warm.     Capillary Refill: Capillary refill takes less than 2 seconds.  Neurological:     General: No focal deficit present.     Mental Status: She is alert.  Psychiatric:        Mood and Affect: Mood normal.        Behavior: Behavior normal.        Thought Content: Thought content normal.        Judgment: Judgment normal.         Data: LEFT              Reflux NoRefluxReflux TimeDiameter cmsComments                              Yes                                   +------------------+---------+------+-----------+------------+--------+  CFV                        yes   >1 second                        +------------------+---------+------+-----------+------------+--------+  FV mid                      yes   >1 second                       +------------------+---------+------+-----------+------------+--------+  Popliteal                  yes   >1 second                       +------------------+---------+------+-----------+------------+--------+  GSV at SFJ                  yes    >500 ms      0.78              +------------------+---------+------+-----------+------------+--------+  GSV prox thigh              yes    >500 ms      0.59              +------------------+---------+------+-----------+------------+--------+  GSV mid thigh               yes    >500 ms      0.34              +------------------+---------+------+-----------+------------+--------+  GSV dist thigh              yes    >500 ms      0.38              +------------------+---------+------+-----------+------------+--------+  GSV at knee                 yes    >500 ms      0.44              +------------------+---------+------+-----------+------------+--------+  GSV prox calf  yes    >500 ms      0.28              +------------------+---------+------+-----------+------------+--------+  SSV Pop Fossa     no                            0.29              +------------------+---------+------+-----------+------------+--------+  anterior accessory          yes    >500 ms      0.41              +------------------+---------+------+-----------+------------+--------+         Summary:  Left:  - No evidence of deep vein thrombosis from the common femoral through the  popliteal veins.  - No evidence of superficial venous thrombosis.  - The deep venous system is not competent.  - The great saphenous vein is not competent.  - The anterior accessory vein is not competent.  - The small saphenous vein is competent.        Assessment/Plan:     52 year old female with C3 venous disease with painful varicosities of the left thigh and leg.  I independently evaluated her great saphenous vein and this gives rise to her varicosities.  We discussed with her the options being continued compressive therapy versus surgical intervention to require laser ablation and stab phlebectomy between greater than 20.  We discussed the risk particularly of DVT especially since she has had 1 in the past as well as the benefits and she demonstrates good understanding.  Will get her scheduled for Thursday in the near future.      Waynetta Sandy MD [facility]

## 2023-03-31 ENCOUNTER — Other Ambulatory Visit: Payer: Self-pay | Admitting: *Deleted

## 2023-03-31 DIAGNOSIS — I83812 Varicose veins of left lower extremities with pain: Secondary | ICD-10-CM

## 2023-06-02 ENCOUNTER — Other Ambulatory Visit: Payer: Self-pay | Admitting: *Deleted

## 2023-06-02 MED ORDER — LORAZEPAM 1 MG PO TABS: ORAL_TABLET | ORAL | 0 refills | Status: DC

## 2023-06-05 ENCOUNTER — Inpatient Hospital Stay: Payer: BC Managed Care – PPO | Attending: Family

## 2023-06-05 ENCOUNTER — Inpatient Hospital Stay: Payer: BC Managed Care – PPO | Admitting: Family

## 2023-06-05 ENCOUNTER — Other Ambulatory Visit: Payer: Self-pay | Admitting: Family

## 2023-06-05 DIAGNOSIS — Z7901 Long term (current) use of anticoagulants: Secondary | ICD-10-CM | POA: Insufficient documentation

## 2023-06-05 DIAGNOSIS — I82432 Acute embolism and thrombosis of left popliteal vein: Secondary | ICD-10-CM

## 2023-06-05 DIAGNOSIS — Z79899 Other long term (current) drug therapy: Secondary | ICD-10-CM | POA: Insufficient documentation

## 2023-06-05 DIAGNOSIS — Z7982 Long term (current) use of aspirin: Secondary | ICD-10-CM | POA: Insufficient documentation

## 2023-06-05 DIAGNOSIS — I2699 Other pulmonary embolism without acute cor pulmonale: Secondary | ICD-10-CM

## 2023-06-05 DIAGNOSIS — Z86718 Personal history of other venous thrombosis and embolism: Secondary | ICD-10-CM | POA: Insufficient documentation

## 2023-06-11 ENCOUNTER — Ambulatory Visit (INDEPENDENT_AMBULATORY_CARE_PROVIDER_SITE_OTHER): Payer: BC Managed Care – PPO | Admitting: Vascular Surgery

## 2023-06-11 ENCOUNTER — Encounter: Payer: Self-pay | Admitting: Vascular Surgery

## 2023-06-11 VITALS — BP 117/79 | HR 83 | Temp 97.2°F | Resp 16 | Ht 66.0 in | Wt 213.0 lb

## 2023-06-11 DIAGNOSIS — I83812 Varicose veins of left lower extremities with pain: Secondary | ICD-10-CM | POA: Diagnosis not present

## 2023-06-11 HISTORY — PX: ENDOVENOUS ABLATION SAPHENOUS VEIN W/ LASER: SUR449

## 2023-06-11 NOTE — Progress Notes (Signed)
      Patient name: Jenna Davenport MRN: [id number] DOB: 05-12-1971 Sex: female  REASON FOR VISIT: Treatment of left lower extremity varicosities with great saphenous vein ablation and stab phlebectomy  HPI: Orlando Thalmann is a 52 y.o. female with swelling of the left lower extremity and painful varicosities with a large refluxing great saphenous vein on the ipsilateral side giving rise to the varicosities.  She is now here for treatment of lower extremity chronic venous insufficiency with great saphenous vein ablation and stab likely greater than 20.  Current Outpatient Medications  Medication Sig Dispense Refill   chlorthalidone (HYGROTON) 25 MG tablet Take by mouth.     LORazepam (ATIVAN) 1 MG tablet Take 1 tablet 30 minutes prior to leaving house on day of office surgery.  Bring second tablet with you to office on day of office surgery. 2 tablet 0   phentermine (ADIPEX-P) 37.5 MG tablet Take by mouth.     sertraline (ZOLOFT) 50 MG tablet Take 50 mg by mouth daily.     UNABLE TO FIND Take 2 capsules by mouth daily. Med Name: Goli Gummies (blue gummies are stress reliever. Red gummies are for weight loss.)     valACYclovir (VALTREX) 1000 MG tablet Take 1,000 mg by mouth daily.     No current facility-administered medications for this visit.    PHYSICAL EXAM: Vitals:   06/11/23 0838  BP: 117/79  Pulse: 83  Resp: 16  Temp: (!) 97.2 F (36.2 C)  TempSrc: Temporal  SpO2: 100%  Weight: 213 lb (96.6 kg)  Height: 5\' 6"  (1.676 m)   Awake alert and oriented Nonlabored respirations Varicosities on left leg marked with patient standing primarily on the left medial and lateral thigh tracking down both lateral and posterior leg  PROCEDURE: Left greater saphenous vein laser ablation totaling 30 cm and left lower extremity stab phlebectomy greater than 20  TECHNIQUE: Ms. Brunkhorst was placed first a standing at her varicosities were marked.  She was then laid supine on the table great  saphenous vein was identified with ultrasound she was then sterilely prepped and draped the left lower extremity tunnel was called.  Ultrasound was used to identify the great saphenous vein just above the knee the area was anesthetized 1% lidocaine and cannulated with a micropuncture needle followed by wire and a sheath.  We then placed a Rosen wire up to the saphenofemoral junction and a 45 cm catheter was placed to 30 cm just below the saphenofemoral junction approximate 3 cm.  The laser was then placed.  Tumescent anesthesia was instilled along the catheter tract and laser ablation was then performed for a total of 30 cm.  Attention was then turned to the multiple varicosities of the medial and lateral thigh and lateral posterior leg.  The areas were individually anesthetized 1% lidocaine and tumescent anesthesia was instilled and veins were removed with crochet hook and hemostat.  At completion Steri-Strips were placed followed by compressive dressing.  She tolerated the procedure without any complication.  Lemar Livings [facility] [place] [phone]

## 2023-06-11 NOTE — Progress Notes (Signed)
     Laser Ablation Procedure    Date: 06/11/2023   Jenna Davenport DOB:[birth year]  Consent signed: Yes      Surgeon: Lemar Livings MD   Procedure: Laser Ablation: left Greater Saphenous Vein  BP 117/79 (BP Location: Left Arm, Patient Position: Sitting, Cuff Size: Large)   Pulse 83   Temp (!) 97.2 F (36.2 C) (Temporal)   Resp 16   Ht 5\' 6"  (1.676 m)   Wt 213 lb (96.6 kg)   SpO2 100%   BMI 34.38 kg/m   Tumescent Anesthesia: 800 cc 0.9% NaCl with 50 cc Lidocaine HCL 1%  and 15 cc 8.4% NaHCO3  Local Anesthesia: 30 cc Lidocaine HCL and NaHCO3 (ratio 2:1)  7 watts continuous mode     Total energy: 1512 Joules     Total time: 216 seconds  Treatment Length  30 cm   Laser Fiber Ref. #  [id number]   Lot #  [id number]   Stab Phlebectomy: >20 Sites: Thigh and Calf  Patient tolerated procedure well  Notes:  All staff members wore facial masks.  Jenna Davenport took Ativan 1 mg (1 tablet) on 06-11-2023 at 7:35 AM.     Description of Procedure:  After marking the course of the secondary varicosities, the patient was placed on the operating table in the supine position, and the left leg was prepped and draped in sterile fashion.   Local anesthetic was administered and under ultrasound guidance the saphenous vein was accessed with a micro needle and guide wire; then the mirco puncture sheath was placed.  A guide wire was inserted saphenofemoral junction , followed by a 5 french sheath.  The position of the sheath and then the laser fiber below the junction was confirmed using the ultrasound.  Tumescent anesthesia was administered along the course of the saphenous vein using ultrasound guidance. The patient was placed in Trendelenburg position and protective laser glasses were placed on patient and staff, and the laser was fired at 7 watts continuous mode for a total of 1512 joules.   For stab phlebectomies, local anesthetic was administered at the previously marked varicosities, and tumescent  anesthesia was administered around the vessels.  Greater than 20 stab wounds were made using the tip of an 11 blade. And using the vein hook, the phlebectomies were performed using a hemostat to avulse the varicosities.  Adequate hemostasis was achieved.     Steri strips were applied to the stab wounds and ABD pads and thigh high compression stockings were applied.  Ace wrap bandages were applied over the phlebectomy sites and at the top of the saphenofemoral junction. Blood loss was less than 15 cc.  Discharge instructions reviewed with patient and hardcopy of discharge instructions given to patient to take home. The patient taken by wheelchair to her car out of the operating room having tolerated the procedure well.

## 2023-06-19 ENCOUNTER — Other Ambulatory Visit: Payer: Self-pay

## 2023-06-19 ENCOUNTER — Inpatient Hospital Stay: Payer: BC Managed Care – PPO

## 2023-06-19 ENCOUNTER — Inpatient Hospital Stay (HOSPITAL_BASED_OUTPATIENT_CLINIC_OR_DEPARTMENT_OTHER): Payer: BC Managed Care – PPO | Admitting: Family

## 2023-06-19 ENCOUNTER — Encounter: Payer: Self-pay | Admitting: Family

## 2023-06-19 VITALS — BP 114/73 | HR 101 | Temp 98.0°F | Resp 17 | Ht 66.0 in | Wt 220.8 lb

## 2023-06-19 DIAGNOSIS — Z7901 Long term (current) use of anticoagulants: Secondary | ICD-10-CM | POA: Diagnosis not present

## 2023-06-19 DIAGNOSIS — I2699 Other pulmonary embolism without acute cor pulmonale: Secondary | ICD-10-CM

## 2023-06-19 DIAGNOSIS — Z79899 Other long term (current) drug therapy: Secondary | ICD-10-CM | POA: Diagnosis not present

## 2023-06-19 DIAGNOSIS — I82432 Acute embolism and thrombosis of left popliteal vein: Secondary | ICD-10-CM | POA: Diagnosis not present

## 2023-06-19 DIAGNOSIS — Z86718 Personal history of other venous thrombosis and embolism: Secondary | ICD-10-CM | POA: Diagnosis present

## 2023-06-19 DIAGNOSIS — Z7982 Long term (current) use of aspirin: Secondary | ICD-10-CM | POA: Diagnosis not present

## 2023-06-19 LAB — CBC WITH DIFFERENTIAL (CANCER CENTER ONLY)
Abs Immature Granulocytes: 0.02 10*3/uL (ref 0.00–0.07)
Basophils Absolute: 0 10*3/uL (ref 0.0–0.1)
Basophils Relative: 0 %
Eosinophils Absolute: 0.1 10*3/uL (ref 0.0–0.5)
Eosinophils Relative: 1 %
HCT: 36.3 % (ref 36.0–46.0)
Hemoglobin: 11.7 g/dL — ABNORMAL LOW (ref 12.0–15.0)
Immature Granulocytes: 0 %
Lymphocytes Relative: 30 %
Lymphs Abs: 2 10*3/uL (ref 0.7–4.0)
MCH: 28.7 pg (ref 26.0–34.0)
MCHC: 32.2 g/dL (ref 30.0–36.0)
MCV: 89.2 fL (ref 80.0–100.0)
Monocytes Absolute: 0.5 10*3/uL (ref 0.1–1.0)
Monocytes Relative: 8 %
Neutro Abs: 4.2 10*3/uL (ref 1.7–7.7)
Neutrophils Relative %: 61 %
Platelet Count: 270 10*3/uL (ref 150–400)
RBC: 4.07 MIL/uL (ref 3.87–5.11)
RDW: 13.5 % (ref 11.5–15.5)
WBC Count: 6.8 10*3/uL (ref 4.0–10.5)
nRBC: 0 % (ref 0.0–0.2)

## 2023-06-19 LAB — CMP (CANCER CENTER ONLY)
ALT: 9 U/L (ref 0–44)
AST: 21 U/L (ref 15–41)
Albumin: 3.9 g/dL (ref 3.5–5.0)
Alkaline Phosphatase: 67 U/L (ref 38–126)
Anion gap: 7 (ref 5–15)
BUN: 26 mg/dL — ABNORMAL HIGH (ref 6–20)
CO2: 30 mmol/L (ref 22–32)
Calcium: 9.3 mg/dL (ref 8.9–10.3)
Chloride: 103 mmol/L (ref 98–111)
Creatinine: 1.2 mg/dL — ABNORMAL HIGH (ref 0.44–1.00)
GFR, Estimated: 54 mL/min — ABNORMAL LOW (ref >60)
Glucose, Bld: 90 mg/dL (ref 70–99)
Potassium: 3.7 mmol/L (ref 3.5–5.1)
Sodium: 140 mmol/L (ref 135–145)
Total Bilirubin: 0.3 mg/dL (ref 0.3–1.2)
Total Protein: 7 g/dL (ref 6.5–8.1)

## 2023-06-19 NOTE — Progress Notes (Unsigned)
Hematology and Oncology Follow Up Visit  Jenna Davenport [id number] 01/09/1971 52 y.o. 06/19/2023   Principle Diagnosis:  History of LLE DVT and LUL PTE, unprovoked -Previous patient of Dr. Caron Presume -10/2016: small LUL pulmonary embolus; doppler showed acute DVT involving left popliteal vein; on Eliquis since then -10/2018: hypercoag work-up negative for prothrombin gene mutation, Factor V Leiden and APLS   Past Treatment: 10/2016 - 01/2019: Eliquis 5mg  BID 01/2019 - present: Eliquis 2.5mg  BID for secondary ppx - completed October 2022   Current Therapy: EC Aspirin 81 mg daily   Interim History:  Jenna Davenport is here today for follow-up. She states that she had phlebectomy > 20 sites with Dr. Randie Heinz on 06/11/2023. She is on ibuprofen TID so aspirin is stopped. She is taking with food.  She is wearing a compression stocking and states that the swelling has gone down.  Pedal pulses are 2+.  No blood loss, abnormal bruising, or petechiae.  No fever, chills, n/v, cough, rash, dizziness, SOB, chest pain, palpitations, abdominal pain or changes in bowel or bladder habits.  No tenderness, numbness or tingling in her extremities.  No falls or syncope.  Appetite and hydration are good. Weight is stable at 220 lbs.    ECOG Performance Status: 1 - Symptomatic but completely ambulatory  Medications:  Allergies as of 06/19/2023       Reactions   Bee Pollen Itching, Swelling    watery eyes    Pollen Extract Other (See Comments)   Itchy, watery eyes         Medication List        Accurate as of June 19, 2023  3:31 PM. If you have any questions, ask your nurse or doctor.          STOP taking these medications    LORazepam 1 MG tablet Commonly known as: ATIVAN Stopped by: Eileen Stanford, NP       TAKE these medications    chlorthalidone 25 MG tablet Commonly known as: HYGROTON Take by mouth.   cholecalciferol 25 MCG (1000 UNIT) tablet Commonly known as: VITAMIN D3 Take  1,000 Units by mouth daily.   phentermine 37.5 MG tablet Commonly known as: ADIPEX-P Take by mouth.   sertraline 50 MG tablet Commonly known as: ZOLOFT Take 50 mg by mouth daily.   UNABLE TO FIND Take 2 capsules by mouth daily. Med Name: Goli Gummies (blue gummies are stress reliever. Red gummies are for weight loss.)   valACYclovir 1000 MG tablet Commonly known as: VALTREX Take 1,000 mg by mouth daily.        Allergies:  Allergies  Allergen Reactions   Bee Pollen Itching and Swelling     watery eyes    Pollen Extract Other (See Comments)    Itchy, watery eyes     Past Medical History, Surgical history, Social history, and Family History were reviewed and updated.  Review of Systems: All other 10 point review of systems is negative.   Physical Exam:  height is 5\' 6"  (1.676 m) and weight is 220 lb 12.8 oz (100.2 kg). Her temperature is 98 F (36.7 C). Her blood pressure is 114/73 and her pulse is 101 (abnormal). Her respiration is 17 and oxygen saturation is 100%.   Wt Readings from Last 3 Encounters:  06/19/23 220 lb 12.8 oz (100.2 kg)  06/11/23 213 lb (96.6 kg)  03/11/23 217 lb (98.4 kg)    Ocular: Sclerae unicteric, pupils equal, round and reactive to light Ear-nose-throat: Oropharynx clear,  dentition fair Lymphatic: No cervical or supraclavicular adenopathy Lungs no rales or rhonchi, good excursion bilaterally Heart regular rate and rhythm, no murmur appreciated Abd soft, nontender, positive bowel sounds MSK no focal spinal tenderness, no joint edema Neuro: non-focal, well-oriented, appropriate affect Breasts: Deferred   Lab Results  Component Value Date   WBC 6.8 06/19/2023   HGB 11.7 (L) 06/19/2023   HCT 36.3 06/19/2023   MCV 89.2 06/19/2023   PLT 270 06/19/2023   No results found for: "FERRITIN", "IRON", "TIBC", "UIBC", "IRONPCTSAT" Lab Results  Component Value Date   RBC 4.07 06/19/2023   No results found for: "KPAFRELGTCHN", "LAMBDASER",  "KAPLAMBRATIO" No results found for: "IGGSERUM", "IGA", "IGMSERUM" No results found for: "TOTALPROTELP", "ALBUMINELP", "A1GS", "A2GS", "BETS", "BETA2SER", "GAMS", "MSPIKE", "SPEI"   Chemistry      Component Value Date/Time   NA 140 01/21/2022 1002   K 3.3 (L) 01/21/2022 1002   CL 102 01/21/2022 1002   CO2 32 01/21/2022 1002   BUN 14 01/21/2022 1002   CREATININE 1.02 (H) 01/21/2022 1002      Component Value Date/Time   CALCIUM 9.3 01/21/2022 1002   ALKPHOS 75 01/21/2022 1002   AST 27 01/21/2022 1002   ALT 11 01/21/2022 1002   BILITOT 0.6 01/21/2022 1002       Impression and Plan: Jenna Davenport is a very pleasant 52 yo female with history of LLE DVT and LUL PE diagnosed 10/2016. She has been on Eliquis since that time. Hyper coag work up in 2019 was negative. She had been sedentary and then drove to [place] and back at time of diagnosis and feels they may have influenced her thrombotic event.  She completed 3 years of full dose Eliquis as well as 2 years of maintenance in 2022.  So far, she has done well there has been no new recurrence.  She does need DVT prophylaxis prior to and right after any surgery. Patient verbalized understanding and will let us know when she schedules her tummy tuck.  *** Follow-up in 1 year.  She can contact our office with any questions or concerns. We can certainly see her sooner if needed.   Eileen Stanford, NP 6/21/20243:31 PM

## 2023-06-22 ENCOUNTER — Telehealth: Payer: Self-pay | Admitting: *Deleted

## 2023-06-22 NOTE — Telephone Encounter (Signed)
-----   Message from Erenest Blank, NP sent at 06/22/2023 10:51 AM EDT ----- Please let her know that once she has completed her treatment with ibuprofen TID with vascular for recent phlebectomy, she needs to resume her daily coated baby aspirin with food. Thank you!

## 2023-06-25 ENCOUNTER — Ambulatory Visit (HOSPITAL_COMMUNITY): Payer: BC Managed Care – PPO

## 2023-06-25 ENCOUNTER — Encounter: Payer: BC Managed Care – PPO | Admitting: Vascular Surgery

## 2023-07-01 ENCOUNTER — Ambulatory Visit (HOSPITAL_COMMUNITY): Payer: BC Managed Care – PPO | Attending: Vascular Surgery

## 2023-07-01 ENCOUNTER — Encounter: Payer: BC Managed Care – PPO | Admitting: Vascular Surgery

## 2023-07-01 ENCOUNTER — Encounter (HOSPITAL_COMMUNITY): Payer: Self-pay

## 2023-09-03 ENCOUNTER — Emergency Department (HOSPITAL_BASED_OUTPATIENT_CLINIC_OR_DEPARTMENT_OTHER): Payer: BC Managed Care – PPO

## 2023-09-03 ENCOUNTER — Encounter (HOSPITAL_BASED_OUTPATIENT_CLINIC_OR_DEPARTMENT_OTHER): Payer: Self-pay

## 2023-09-03 ENCOUNTER — Other Ambulatory Visit: Payer: Self-pay

## 2023-09-03 DIAGNOSIS — S59201A Unspecified physeal fracture of lower end of radius, right arm, initial encounter for closed fracture: Secondary | ICD-10-CM | POA: Insufficient documentation

## 2023-09-03 DIAGNOSIS — M7989 Other specified soft tissue disorders: Secondary | ICD-10-CM | POA: Diagnosis not present

## 2023-09-03 DIAGNOSIS — S63391A Traumatic rupture of other ligament of right wrist, initial encounter: Secondary | ICD-10-CM | POA: Insufficient documentation

## 2023-09-03 DIAGNOSIS — S6991XA Unspecified injury of right wrist, hand and finger(s), initial encounter: Secondary | ICD-10-CM | POA: Diagnosis present
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
## 2023-09-04 ENCOUNTER — Emergency Department (HOSPITAL_BASED_OUTPATIENT_CLINIC_OR_DEPARTMENT_OTHER)
Admission: EM | Admit: 2023-09-04 | Discharge: 2023-09-04 | Disposition: A | Discharge status: Home / Self Care | Payer: BC Managed Care – PPO | Attending: Emergency Medicine | Admitting: Emergency Medicine

## 2023-09-04 DIAGNOSIS — M25331 Other instability, right wrist: Secondary | ICD-10-CM

## 2023-09-04 DIAGNOSIS — S59201A Unspecified physeal fracture of lower end of radius, right arm, initial encounter for closed fracture: Secondary | ICD-10-CM

## 2023-09-04 MED ORDER — KETOROLAC TROMETHAMINE 60 MG/2ML IM SOLN
30.0000 mg | Freq: Once | INTRAMUSCULAR | Status: AC
  Administered 2023-09-04: 30 mg via INTRAMUSCULAR
  Filled 2023-09-04: qty 2

## 2023-09-04 MED ORDER — TRAMADOL HCL 50 MG PO TABS
50.0000 mg | ORAL_TABLET | Freq: Four times a day (QID) | ORAL | 0 refills | Status: DC | PRN
Start: 2023-09-04 — End: 2023-10-02

## 2023-09-04 MED ORDER — DICLOFENAC SODIUM ER 100 MG PO TB24: 100.0000 mg | ORAL_TABLET | Freq: Every day | ORAL | 0 refills | Status: AC
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
## 2023-09-29 ENCOUNTER — Encounter: Payer: Self-pay | Admitting: Emergency Medicine

## 2023-09-29 ENCOUNTER — Other Ambulatory Visit: Payer: Self-pay

## 2023-09-29 ENCOUNTER — Ambulatory Visit
Admission: EM | Admit: 2023-09-29 | Discharge: 2023-09-29 | Disposition: A | Discharge status: Home / Self Care | Payer: BC Managed Care – PPO | Attending: Internal Medicine | Admitting: Internal Medicine

## 2023-09-29 ENCOUNTER — Ambulatory Visit: Payer: BC Managed Care – PPO

## 2023-09-29 DIAGNOSIS — M79645 Pain in left finger(s): Secondary | ICD-10-CM | POA: Diagnosis not present

## 2023-09-29 DIAGNOSIS — S62665A Nondisplaced fracture of distal phalanx of left ring finger, initial encounter for closed fracture: Secondary | ICD-10-CM

## 2023-09-29 MED ORDER — HYDROCODONE-ACETAMINOPHEN 5-325 MG PO TABS
1.0000 | ORAL_TABLET | Freq: Four times a day (QID) | ORAL | 0 refills | Status: DC | PRN
Start: 2023-09-29 — End: 2023-10-02

## 2023-09-29 NOTE — ED Notes (Signed)
Pt had to leave for emergency at home and will return

## 2023-09-29 NOTE — Discharge Instructions (Signed)
You have a fracture of the end of your finger.  Splint applied.  Follow-up with your hand specialist.

## 2023-09-29 NOTE — ED Triage Notes (Signed)
Pt here for left ring finger injury around cuticle with some drainage, pain and swelling

## 2023-09-29 NOTE — ED Provider Notes (Signed)
[facility]    CSN: [id number] Arrival date & time: 09/29/23  1011      History   Chief Complaint Chief Complaint  Patient presents with   Finger Injury    HPI Raymond Azure is a 52 y.o. female.   Patient presents with injury to left fourth digit that occurred about a week ago.  Patient states that she jammed her finger while "pulling on something".  She has been having limited mobility due to a right wrist fracture recently which caused her to injure her left finger.  Reports that she has been having significant pain and swelling.  Patient is not reporting any numbness or tingling.     Past Medical History:  Diagnosis Date   Anemia    Pulmonary embolism [facility])     Patient Active Problem List   Diagnosis Date Noted   Acute embolism and thrombosis of unspecified deep veins of left lower extremity (HCC) 05/10/2021   Amenorrhea 05/10/2021   Anxiety about health 05/10/2021   Excessive and frequent menstruation with irregular cycle 05/10/2021   Family history of ischemic heart disease (IHD) 05/10/2021   Genital herpes simplex 05/10/2021   History of DVT (deep vein thrombosis) 05/10/2021   Personal history of pulmonary embolism 05/10/2021   Varicose veins of lower extremity 05/10/2021   Postinflammatory hyperpigmentation 06/01/2019   Cervical high risk HPV (human papillomavirus) test positive 06/24/2017   Acute deep vein thrombosis (DVT) of popliteal vein of left lower extremity (HCC) 11/11/2016   Essential hypertension 11/11/2016   Tachycardia 11/09/2016   Dyspnea on effort 11/09/2016   Rash 11/09/2016   Hematuria 11/09/2016   Iron deficiency anemia 11/09/2016   Acute pulmonary embolism (HCC) 11/08/2016   Obesity 11/08/2016   Acute right flank pain 11/08/2016    Past Surgical History:  Procedure Laterality Date   CHOLECYSTECTOMY     2007   ENDOVENOUS ABLATION SAPHENOUS VEIN W/ LASER Left 06/11/2023   endovenous laser ablation left greater saphenous  vein and stab phlebectomy > 20 incisions left leg by Lemar Livings MD   FOOT TENDON SURGERY Left 11/2017   GASTRIC BYPASS     2011   TOTAL HIP ARTHROPLASTY Left    TUBAL LIGATION     2008    OB History   No obstetric history on file.      Home Medications    Prior to Admission medications   Medication Sig Start Date End Date Taking? Authorizing Provider  HYDROcodone-acetaminophen (NORCO/VICODIN) 5-325 MG tablet Take 1 tablet by mouth every 6 (six) hours as needed for severe pain. 09/29/23  Yes Slate Debroux, Acie Fredrickson, FNP  chlorthalidone (HYGROTON) 25 MG tablet Take by mouth. 07/26/20   [provider]  cholecalciferol (VITAMIN D3) 25 MCG (1000 UNIT) tablet Take 1,000 Units by mouth daily.    [provider]  Diclofenac Sodium CR 100 MG 24 hr tablet Take 1 tablet (100 mg total) by mouth daily. 09/04/23   Palumbo, April, MD  phentermine (ADIPEX-P) 37.5 MG tablet Take by mouth. Patient not taking: Reported on 06/19/2023 10/31/22   [provider]  sertraline (ZOLOFT) 50 MG tablet Take 50 mg by mouth daily.    [provider]  traMADol (ULTRAM) 50 MG tablet Take 1 tablet (50 mg total) by mouth every 6 (six) hours as needed for severe pain. 09/04/23   Palumbo, April, MD  UNABLE TO FIND Take 2 capsules by mouth daily. Med Name: Goli Gummies (blue gummies are stress reliever. Red gummies are  for weight loss.)    [provider]  valACYclovir (VALTREX) 1000 MG tablet Take 1,000 mg by mouth daily.    [provider]    Family History Family History  Problem Relation Age of Onset   Seizures Mother    Stroke Mother    Heart failure Mother    Hyperlipidemia Mother    Hypertension Father    Diabetes Father    Seizures Father    Hyperlipidemia Father    Colon cancer Neg Hx     Social History Social History   Tobacco Use   Smoking status: Never   Smokeless tobacco: Never  Vaping Use   Vaping status: Never Used  Substance Use Topics   Alcohol  use: Yes    Alcohol/week: 5.0 standard drinks of alcohol    Types: 5 Standard drinks or equivalent per week    Comment: occasionally   Drug use: No     Allergies   Bee pollen and Pollen extract   Review of Systems Review of Systems Per HPI  Physical Exam Triage Vital Signs ED Triage Vitals [09/29/23 1135]  Encounter Vitals Group     BP 116/77     Systolic BP Percentile      Diastolic BP Percentile      Pulse Rate 77     Resp 18     Temp 97.8 F (36.6 C)     Temp Source Oral     SpO2 95 %     Weight      Height      Head Circumference      Peak Flow      Pain Score 7     Pain Loc      Pain Education      Exclude from Growth Chart    No data found.  Updated Vital Signs BP 116/77 (BP Location: Left Arm)   Pulse 77   Temp 97.8 F (36.6 C) (Oral)   Resp 18   SpO2 95%   Visual Acuity Right Eye Distance:   Left Eye Distance:   Bilateral Distance:    Right Eye Near:   Left Eye Near:    Bilateral Near:     Physical Exam Constitutional:      General: She is not in acute distress.    Appearance: Normal appearance. She is not toxic-appearing or diaphoretic.  HENT:     Head: Normocephalic and atraumatic.  Eyes:     Extraocular Movements: Extraocular movements intact.     Conjunctiva/sclera: Conjunctivae normal.  Pulmonary:     Effort: Pulmonary effort is normal.  Musculoskeletal:     Comments: Patient has swelling and mild erythema present to the distal end of the left fourth digit.  Nail appears intact to the nailbed but the patient does have fake nail overlying as well.  No bleeding or purulent drainage noted.  Limited range of motion of finger due to pain.  Capillary refill and pulses appear to be intact.  No abrasions or lacerations noted. No subungual hematoma noted.   Neurological:     General: No focal deficit present.     Mental Status: She is alert and oriented to person, place, and time. Mental status is at baseline.  Psychiatric:        Mood and  Affect: Mood normal.        Behavior: Behavior normal.        Thought Content: Thought content normal.        Judgment:  Judgment normal.      UC Treatments / Results  Labs (all labs ordered are listed, but only abnormal results are displayed) Labs Reviewed - No data to display  EKG   Radiology DG Finger Ring Left  Result Date: 09/29/2023 CLINICAL DATA:  Left fourth finger injury. EXAM: LEFT RING FINGER 2+V COMPARISON:  None Available. FINDINGS: Nondisplaced fracture is seen involving the proximal portion of the fourth distal phalanx. Joint spaces are intact. IMPRESSION: Nondisplaced fourth distal phalangeal fracture. Electronically Signed   By: Lupita Raider M.D.   On: 09/29/2023 13:45    Procedures Procedures (including critical care time)  Medications Ordered in UC Medications - No data to display  Initial Impression / Assessment and Plan / UC Course  I have reviewed the triage vital signs and the nursing notes.  Pertinent labs & imaging results that were available during my care of the patient were reviewed by me and considered in my medical decision making (see chart for details).     X-ray is showing a fracture of the distal end of the left fourth digit on x-ray.  Finger splint applied by clinical staff.  Patient states that she has taken several different over-the-counter pain medications including previously prescribed tramadol for her right wrist fracture with no improvement in pain.  She is requesting pain medication.  Given patient is reporting severe pain with no resolution with over-the-counter medications and previously prescribed leftover tramadol, will prescribe a few pills of Norco but discussed with patient the importance of not taking tramadol or benzodiazepine with Norco due to adverse effects that can be caused.  She voiced understanding of this.  Also advised patient that this medication contains Tylenol and do not take additional Tylenol while taking Norco.   PDMP reviewed.  It appears that patient has only been prescribed intermittent narcotic medication but is not on chronic medication so that should be safe. Although discussed with patient we will be unable to provide additional narcotic pain medication after norco is complete and will need to follow up with ortho or PCP for additional pain management. Reminded patient this medication can make her drowsy and do not drive or drink alcohol with it.  Advised supportive care including ice application and elevation.  She states that she already has hand specialist given she recently fractured her right wrist so encouraged her to follow-up with them as soon as possible for further evaluation and management.  Patient verbalized understanding and was agreeable with plan. Final Clinical Impressions(s) / UC Diagnoses   Final diagnoses:  Closed nondisplaced fracture of distal phalanx of left ring finger, initial encounter     Discharge Instructions      You have a fracture of the end of your finger.  Splint applied.  Follow-up with your hand specialist.     ED Prescriptions     Medication Sig Dispense Auth. Provider   HYDROcodone-acetaminophen (NORCO/VICODIN) 5-325 MG tablet Take 1 tablet by mouth every 6 (six) hours as needed for severe pain. 5 tablet [place], [place] [address], [place]      I have reviewed the PDMP during this encounter.   Gustavus Bryant, [place] 09/29/23 [phone]

## 2023-10-02 ENCOUNTER — Encounter (HOSPITAL_COMMUNITY): Payer: Self-pay | Admitting: Orthopedic Surgery

## 2023-10-02 ENCOUNTER — Other Ambulatory Visit: Payer: Self-pay

## 2023-10-02 ENCOUNTER — Ambulatory Visit (HOSPITAL_COMMUNITY)
Admission: RE | Admit: 2023-10-02 | Discharge: 2023-10-02 | Disposition: A | Discharge status: Home / Self Care | Payer: BC Managed Care – PPO | Attending: Orthopedic Surgery | Admitting: Orthopedic Surgery

## 2023-10-02 ENCOUNTER — Encounter (HOSPITAL_COMMUNITY)
Admission: RE | Disposition: A | Discharge status: Home / Self Care | Payer: Self-pay | Source: Home / Self Care | Attending: Orthopedic Surgery

## 2023-10-02 ENCOUNTER — Other Ambulatory Visit: Payer: Self-pay | Admitting: Orthopedic Surgery

## 2023-10-02 ENCOUNTER — Ambulatory Visit (HOSPITAL_COMMUNITY): Payer: BC Managed Care – PPO | Admitting: Anesthesiology

## 2023-10-02 DIAGNOSIS — I10 Essential (primary) hypertension: Secondary | ICD-10-CM | POA: Insufficient documentation

## 2023-10-02 DIAGNOSIS — S62635B Displaced fracture of distal phalanx of left ring finger, initial encounter for open fracture: Secondary | ICD-10-CM | POA: Insufficient documentation

## 2023-10-02 DIAGNOSIS — E669 Obesity, unspecified: Secondary | ICD-10-CM | POA: Insufficient documentation

## 2023-10-02 DIAGNOSIS — D649 Anemia, unspecified: Secondary | ICD-10-CM | POA: Diagnosis not present

## 2023-10-02 DIAGNOSIS — Z9884 Bariatric surgery status: Secondary | ICD-10-CM | POA: Insufficient documentation

## 2023-10-02 DIAGNOSIS — F419 Anxiety disorder, unspecified: Secondary | ICD-10-CM | POA: Diagnosis not present

## 2023-10-02 DIAGNOSIS — L03012 Cellulitis of left finger: Secondary | ICD-10-CM | POA: Insufficient documentation

## 2023-10-02 DIAGNOSIS — Z6834 Body mass index (BMI) 34.0-34.9, adult: Secondary | ICD-10-CM | POA: Diagnosis not present

## 2023-10-02 DIAGNOSIS — W19XXXA Unspecified fall, initial encounter: Secondary | ICD-10-CM | POA: Diagnosis not present

## 2023-10-02 HISTORY — PX: INCISION AND DRAINAGE: SHX5863

## 2023-10-02 LAB — BASIC METABOLIC PANEL
Anion gap: 15 (ref 5–15)
BUN: 18 mg/dL (ref 6–20)
CO2: 25 mmol/L (ref 22–32)
Calcium: 8.8 mg/dL — ABNORMAL LOW (ref 8.9–10.3)
Chloride: 97 mmol/L — ABNORMAL LOW (ref 98–111)
Creatinine, Ser: 1.03 mg/dL — ABNORMAL HIGH (ref 0.44–1.00)
GFR, Estimated: >60 mL/min (ref >60)
Glucose, Bld: 85 mg/dL (ref 70–99)
Potassium: 3.3 mmol/L — ABNORMAL LOW (ref 3.5–5.1)
Sodium: 137 mmol/L (ref 135–145)

## 2023-10-02 LAB — CBC
HCT: 34.7 % — ABNORMAL LOW (ref 36.0–46.0)
Hemoglobin: 11.3 g/dL — ABNORMAL LOW (ref 12.0–15.0)
MCH: 28.6 pg (ref 26.0–34.0)
MCHC: 32.6 g/dL (ref 30.0–36.0)
MCV: 87.8 fL (ref 80.0–100.0)
Platelets: 306 10*3/uL (ref 150–400)
RBC: 3.95 MIL/uL (ref 3.87–5.11)
RDW: 14.1 % (ref 11.5–15.5)
WBC: 6 10*3/uL (ref 4.0–10.5)
nRBC: 0 % (ref 0.0–0.2)

## 2023-10-02 SURGERY — INCISION AND DRAINAGE
Anesthesia: General | Site: Finger | Laterality: Left

## 2023-10-02 MED ORDER — ONDANSETRON HCL 4 MG/2ML IJ SOLN
INTRAMUSCULAR | Status: AC
  Filled 2023-10-02: qty 2

## 2023-10-02 MED ORDER — MIDAZOLAM HCL 2 MG/2ML IJ SOLN
INTRAMUSCULAR | Status: AC
  Filled 2023-10-02: qty 2

## 2023-10-02 MED ORDER — LIDOCAINE 2% (20 MG/ML) 5 ML SYRINGE
INTRAMUSCULAR | Status: AC
  Filled 2023-10-02: qty 5

## 2023-10-02 MED ORDER — MIDAZOLAM HCL 2 MG/2ML IJ SOLN
INTRAMUSCULAR | Status: DC | PRN
  Administered 2023-10-02: 2 mg via INTRAVENOUS

## 2023-10-02 MED ORDER — ACETAMINOPHEN 325 MG PO TABS: 325.0000 mg | ORAL_TABLET | ORAL | Status: DC | PRN

## 2023-10-02 MED ORDER — FENTANYL CITRATE (PF) 100 MCG/2ML IJ SOLN: 25.0000 ug | INTRAMUSCULAR | Status: DC | PRN

## 2023-10-02 MED ORDER — OXYCODONE HCL 5 MG/5ML PO SOLN: 5.0000 mg | Freq: Once | ORAL | Status: AC | PRN

## 2023-10-02 MED ORDER — PROPOFOL 10 MG/ML IV BOLUS
INTRAVENOUS | Status: DC | PRN
  Administered 2023-10-02: 50 mg via INTRAVENOUS
  Administered 2023-10-02: 100 ug/kg/min via INTRAVENOUS
  Administered 2023-10-02: 100 mg via INTRAVENOUS
  Administered 2023-10-02: 50 mg via INTRAVENOUS

## 2023-10-02 MED ORDER — CEFAZOLIN SODIUM-DEXTROSE 2-3 GM-%(50ML) IV SOLR
INTRAVENOUS | Status: DC | PRN
Start: 2023-10-02 — End: 2023-10-02
  Administered 2023-10-02: 2 g via INTRAVENOUS

## 2023-10-02 MED ORDER — BUPIVACAINE HCL (PF) 0.25 % IJ SOLN
INTRAMUSCULAR | Status: DC | PRN
  Administered 2023-10-02: 9 mL

## 2023-10-02 MED ORDER — PROPOFOL 10 MG/ML IV BOLUS
INTRAVENOUS | Status: AC
  Filled 2023-10-02: qty 20

## 2023-10-02 MED ORDER — ORAL CARE MOUTH RINSE: 15.0000 mL | Freq: Once | OROMUCOSAL | Status: AC

## 2023-10-02 MED ORDER — BUPIVACAINE HCL (PF) 0.25 % IJ SOLN
INTRAMUSCULAR | Status: AC
  Filled 2023-10-02: qty 30

## 2023-10-02 MED ORDER — CHLORHEXIDINE GLUCONATE 0.12 % MT SOLN
15.0000 mL | Freq: Once | OROMUCOSAL | Status: AC
  Administered 2023-10-02: 15 mL via OROMUCOSAL
  Filled 2023-10-02: qty 15

## 2023-10-02 MED ORDER — SULFAMETHOXAZOLE-TRIMETHOPRIM 800-160 MG PO TABS: 1.0000 | ORAL_TABLET | Freq: Two times a day (BID) | ORAL | 0 refills | Status: AC

## 2023-10-02 MED ORDER — FENTANYL CITRATE (PF) 250 MCG/5ML IJ SOLN
INTRAMUSCULAR | Status: DC | PRN
  Administered 2023-10-02: 50 ug via INTRAVENOUS
  Administered 2023-10-02: 100 ug via INTRAVENOUS

## 2023-10-02 MED ORDER — LIDOCAINE 2% (20 MG/ML) 5 ML SYRINGE
INTRAMUSCULAR | Status: DC | PRN
  Administered 2023-10-02: 60 mg via INTRAVENOUS

## 2023-10-02 MED ORDER — FENTANYL CITRATE (PF) 250 MCG/5ML IJ SOLN
INTRAMUSCULAR | Status: AC
  Filled 2023-10-02: qty 5

## 2023-10-02 MED ORDER — OXYCODONE HCL 5 MG PO TABS
5.0000 mg | ORAL_TABLET | Freq: Once | ORAL | Status: AC | PRN
  Administered 2023-10-02: 5 mg via ORAL

## 2023-10-02 MED ORDER — ONDANSETRON HCL 4 MG/2ML IJ SOLN
INTRAMUSCULAR | Status: DC | PRN
  Administered 2023-10-02: 4 mg via INTRAVENOUS

## 2023-10-02 MED ORDER — 0.9 % SODIUM CHLORIDE (POUR BTL) OPTIME
TOPICAL | Status: DC | PRN
  Administered 2023-10-02: 1000 mL

## 2023-10-02 MED ORDER — OXYCODONE-ACETAMINOPHEN 5-325 MG PO TABS
ORAL_TABLET | ORAL | 0 refills | Status: AC
Start: 2023-10-02 — End: ?

## 2023-10-02 MED ORDER — LACTATED RINGERS IV SOLN: INTRAVENOUS | Status: DC

## 2023-10-02 MED ORDER — OXYCODONE HCL 5 MG PO TABS
ORAL_TABLET | ORAL | Status: AC
  Filled 2023-10-02: qty 1

## 2023-10-02 MED ORDER — CEFAZOLIN SODIUM 1 G IJ SOLR
INTRAMUSCULAR | Status: AC
  Filled 2023-10-02: qty 20

## 2023-10-02 SURGICAL SUPPLY — 65 items
ADAPTER CATH SYR TO TUBING 38M (ADAPTER) ×2 IMPLANT
ADPR CATH LL SYR 3/32 TPR (ADAPTER) ×1
BAG COUNTER SPONGE SURGICOUNT (BAG) ×2 IMPLANT
BAG SPNG CNTER NS LX DISP (BAG) ×1
BNDG CMPR 5X2 CHSV 1 LYR STRL (GAUZE/BANDAGES/DRESSINGS)
BNDG CMPR 5X3 KNIT ELC UNQ LF (GAUZE/BANDAGES/DRESSINGS) ×1
BNDG CMPR 9X4 STRL LF SNTH (GAUZE/BANDAGES/DRESSINGS)
BNDG COHESIVE 1X5 TAN STRL LF (GAUZE/BANDAGES/DRESSINGS) IMPLANT
BNDG COHESIVE 2X5 TAN ST LF (GAUZE/BANDAGES/DRESSINGS) IMPLANT
BNDG ELASTIC 3INX 5YD STR LF (GAUZE/BANDAGES/DRESSINGS) ×2 IMPLANT
BNDG ELASTIC 4X5.8 VLCR STR LF (GAUZE/BANDAGES/DRESSINGS) ×2 IMPLANT
BNDG ESMARK 4X9 LF (GAUZE/BANDAGES/DRESSINGS) IMPLANT
BNDG GAUZE DERMACEA FLUFF 4 (GAUZE/BANDAGES/DRESSINGS) ×2 IMPLANT
BNDG GZE DERMACEA 4 6PLY (GAUZE/BANDAGES/DRESSINGS) ×1
CANNULA VESSEL 3MM 2 BLNT TIP (CANNULA) IMPLANT
CORD BIPOLAR FORCEPS 12FT (ELECTRODE) ×2 IMPLANT
COVER SURGICAL LIGHT HANDLE (MISCELLANEOUS) ×2 IMPLANT
CUFF TOURN SGL QUICK 18X4 (TOURNIQUET CUFF) IMPLANT
CUFF TOURN SGL QUICK 24 (TOURNIQUET CUFF)
CUFF TRNQT CYL 24X4X16.5-23 (TOURNIQUET CUFF) IMPLANT
DRAIN PENROSE 12X.25 LTX STRL (MISCELLANEOUS) IMPLANT
DRSG XEROFORM 1X8 (GAUZE/BANDAGES/DRESSINGS) IMPLANT
GAUZE PACKING IODOFORM 1/2INX (GAUZE/BANDAGES/DRESSINGS) IMPLANT
GAUZE PAD ABD 8X10 STRL (GAUZE/BANDAGES/DRESSINGS) ×4 IMPLANT
GAUZE SPONGE 4X4 12PLY STRL (GAUZE/BANDAGES/DRESSINGS) ×2 IMPLANT
GAUZE XEROFORM 1X8 LF (GAUZE/BANDAGES/DRESSINGS) ×2 IMPLANT
GLOVE BIO SURGEON STRL SZ7.5 (GLOVE) ×2 IMPLANT
GLOVE BIOGEL PI IND STRL 8 (GLOVE) ×2 IMPLANT
GLOVE BIOGEL PI IND STRL 8.5 (GLOVE) IMPLANT
GLOVE PI ORTHO PRO STRL SZ8 (GLOVE) IMPLANT
GLOVE SURG ORTHO 8.0 STRL STRW (GLOVE) IMPLANT
GOWN STRL REUS W/ TWL LRG LVL3 (GOWN DISPOSABLE) ×2 IMPLANT
GOWN STRL REUS W/ TWL XL LVL3 (GOWN DISPOSABLE) ×2 IMPLANT
GOWN STRL REUS W/TWL LRG LVL3 (GOWN DISPOSABLE) ×1
GOWN STRL REUS W/TWL XL LVL3 (GOWN DISPOSABLE) ×1
KIT BASIN OR (CUSTOM PROCEDURE TRAY) ×2 IMPLANT
KIT TURNOVER KIT B (KITS) ×2 IMPLANT
LOOP VASCLR MAXI BLUE 18IN ST (MISCELLANEOUS) IMPLANT
LOOP VASCULAR MAXI 18 BLUE (MISCELLANEOUS)
LOOPS VASCLR MAXI BLUE 18IN ST (MISCELLANEOUS) IMPLANT
MANIFOLD NEPTUNE II (INSTRUMENTS) IMPLANT
NDL HYPO 25X1 1.5 SAFETY (NEEDLE) IMPLANT
NEEDLE HYPO 25X1 1.5 SAFETY (NEEDLE) IMPLANT
NS IRRIG 1000ML POUR BTL (IV SOLUTION) ×2 IMPLANT
PACK ORTHO EXTREMITY (CUSTOM PROCEDURE TRAY) ×2 IMPLANT
PAD ARMBOARD 7.5X6 YLW CONV (MISCELLANEOUS) ×4 IMPLANT
SET CYSTO W/LG BORE CLAMP LF (SET/KITS/TRAYS/PACK) IMPLANT
SOL PREP POV-IOD 4OZ 10% (MISCELLANEOUS) ×4 IMPLANT
SPIKE FLUID TRANSFER (MISCELLANEOUS) ×2 IMPLANT
SPLINT FINGER 2.25 911902 (SOFTGOODS) IMPLANT
SPONGE T-LAP 4X18 ~~LOC~~+RFID (SPONGE) ×2 IMPLANT
SUT CHROMIC 6 0 PS 4 (SUTURE) IMPLANT
SUT ETHILON 4 0 P 3 18 (SUTURE) IMPLANT
SUT ETHILON 4 0 PS 2 18 (SUTURE) IMPLANT
SUT MON AB 5-0 P3 18 (SUTURE) IMPLANT
SWAB COLLECTION DEVICE MRSA (MISCELLANEOUS) IMPLANT
SWAB CULTURE ESWAB REG 1ML (MISCELLANEOUS) IMPLANT
SYR 20ML LL LF (SYRINGE) ×2 IMPLANT
SYR CONTROL 10ML LL (SYRINGE) IMPLANT
TOWEL GREEN STERILE (TOWEL DISPOSABLE) ×2 IMPLANT
TUBE CONNECTING 12X1/4 (SUCTIONS) ×2 IMPLANT
TUBE NG 5FR 35IN ENFIT (TUBING) IMPLANT
UNDERPAD 30X36 HEAVY ABSORB (UNDERPADS AND DIAPERS) ×2 IMPLANT
VASCULAR TIE MAXI BLUE 18IN ST (MISCELLANEOUS)
YANKAUER SUCT BULB TIP NO VENT (SUCTIONS) ×2 IMPLANT

## 2023-10-02 NOTE — Discharge Instructions (Signed)

## 2023-10-02 NOTE — Anesthesia Procedure Notes (Signed)
Procedure Name: LMA Insertion Date/Time: 10/02/2023 6:38 PM  Performed by: Sharyn Dross, CRNAPre-anesthesia Checklist: Patient identified, Emergency Drugs available, Suction available and Patient being monitored Patient Re-evaluated:Patient Re-evaluated prior to induction Oxygen Delivery Method: Circle system utilized Preoxygenation: Pre-oxygenation with 100% oxygen Induction Type: IV induction LMA: LMA inserted LMA Size: 4.0 Number of attempts: 1 Placement Confirmation: positive ETCO2 and CO2 detector Dental Injury: Teeth and Oropharynx as per pre-operative assessment

## 2023-10-02 NOTE — Op Note (Addendum)
NAME: Jenna Davenport MEDICAL RECORD NO: [id number] DATE OF BIRTH: 1971/05/27 FACILITY: [facility] LOCATION: [facility] OR PHYSICIAN: Tami Ribas, MD   OPERATIVE REPORT   DATE OF PROCEDURE: 10/02/23    PREOPERATIVE DIAGNOSIS: Left ring finger paronychia, open distal phalanx fracture, nailbed injury, possible DIP joint infection   POSTOPERATIVE DIAGNOSIS: Left ring finger paronychia, open distal phalanx fracture, nailbed injury, DIP joint infection   PROCEDURE: 1.  Incision and drainage left ring finger DIP joint infection 2.  Left ring finger incision and drainage paronychia with removal of nail 3.  Irrigation debridement of open distal phalanx fracture 4.  Reduction open distal phalanx fracture 5.  Repair of nailbed laceration   SURGEON:  Betha Loa, M.D.   ASSISTANT: none   ANESTHESIA:  General   INTRAVENOUS FLUIDS:  Per anesthesia flow sheet.   ESTIMATED BLOOD LOSS:  Minimal.   COMPLICATIONS:  None.   SPECIMENS: Cultures to micro   TOURNIQUET TIME:    Total Tourniquet Time Documented: Upper Arm (Left) - 31 minutes Total: Upper Arm (Left) - 31 minutes    DISPOSITION:  Stable to PACU.   INDICATIONS: 52 year old female states she fell injuring her left ring finger 1 week ago.  She has had progressively worsening swelling pain and erythema.  She was seen at urgent care where radiographs were taken revealing a distal phalanx fracture.  She noted drainage from around the nail which has become purulent.  I recommended incision and drainage in the operating room possibly including the DIP joint with likely splinting of the distal phalanx due to the infection.  Risks, benefits and alternatives of surgery were discussed including the risks of blood loss, infection, damage to nerves, vessels, tendons, ligaments, bone for surgery, need for additional surgery, complications with wound healing, continued pain, stiffness.  She voiced understanding of these risks and elected to  proceed.  OPERATIVE COURSE:  After being identified preoperatively by myself,  the patient and I agreed on the procedure and site of the procedure.  The surgical site was marked.  Surgical consent had been signed. Antibiotics were held for intraoperative cultures. She was transferred to the operating room and placed on the operating table in supine position with the Left upper extremity on an arm board.  General anesthesia was induced by the anesthesiologist.  Left upper extremity was prepped and draped in normal sterile orthopedic fashion.  A surgical pause was performed between the surgeons, anesthesia, and operating room staff and all were in agreement as to the patient, procedure, and site of procedure.  Tourniquet at the proximal aspect of the extremity was inflated to 250 mmHg after exsanguination of the arm with an Esmarch bandage.  The nail was removed with a freer elevator.  There was purulence under the nail and coming from under the nail fold.  There was delamination of the epidermis which was sharply removed with the scissors.  The base of the nailbed at the germinal matrix had been avulsed from within the nail fold.  The distal phalanx fracture was exposed within this.  Cultures were taken for aerobes and anaerobes.  The fracture was mobile once the nail was removed.  An incision was made at the dorsal radial aspect of the finger at the level of the DIP joint.  This was carried in subcutaneous tissues by spreading technique.  The DIP joint was entered underneath the extensor tendon.  There was purulence within the DIP joint.  The DIP joint, open fracture, and wounds were copiously irrigated  with sterile saline by cystoscopy tubing and vessel cannula.  The distal phalanx was then reduced and 6-0 chromic suture used to pull the base of the nailbed back into the nail fold.  2 horizontal mattress sutures were placed in an attempt to keep the nailbed in appropriate position while allowing space for  drainage as necessary.  A iodoform gauze wick was placed into the DIP joint.  A piece of Xeroform was placed in the nail fold and the nailbed over dressed with a Xeroform as well.  The wounds were then dressed with a sterile 4 x 4 and wrapped with a Coban dressing lightly.  An AlumaFoam splint was placed and wrapped lightly with Coban dressing.  A digital block of been performed at the start of the case with quarter percent plain Marcaine to aid in postoperative analgesia.  The tourniquet was deflated at 31 minutes.  Fingertips were pink with brisk capillary refill after deflation of tourniquet.  The operative  drapes were broken down.  She was given IV Ancef in the operating room.  The patient was awoken from anesthesia safely.  She was transferred back to the stretcher and taken to PACU in stable condition.  I will see her back in the office in 3-4 days for postoperative followup.  I will give her a prescription for Percocet 5/325 1-2 tabs PO q6 hours prn pain, dispense # 20 and Bactrim DS 1 p.o. twice daily x 7 days.Betha Loa, MD Electronically signed, 10/02/23

## 2023-10-02 NOTE — H&P (Signed)
Jenna Davenport is an 52 y.o. female.   Chief Complaint: finger infection HPI: 52 yo female states she fell injuring left ring finger one week ago.  Has had worsening swelling and pain of finger since.  Has noted purulent drainage from around nail.  She wishes to proceed with operative incision and drainage of the left ring finger.  Allergies:  Allergies  Allergen Reactions   Bee Pollen Itching and Swelling     watery eyes    Pollen Extract Other (See Comments)    Itchy, watery eyes     Past Medical History:  Diagnosis Date   Anemia    Pulmonary embolism (HCC)     Past Surgical History:  Procedure Laterality Date   CHOLECYSTECTOMY     2007   ENDOVENOUS ABLATION SAPHENOUS VEIN W/ LASER Left 06/11/2023   endovenous laser ablation left greater saphenous vein and stab phlebectomy > 20 incisions left leg by Lemar Livings MD   FOOT TENDON SURGERY Left 11/2017   GASTRIC BYPASS     2011   TOTAL HIP ARTHROPLASTY Left    TUBAL LIGATION     2008    Family History: Family History  Problem Relation Age of Onset   Seizures Mother    Stroke Mother    Heart failure Mother    Hyperlipidemia Mother    Hypertension Father    Diabetes Father    Seizures Father    Hyperlipidemia Father    Colon cancer Neg Hx     Social History:   reports that she has never smoked. She has never used smokeless tobacco. She reports current alcohol use of about 5.0 standard drinks of alcohol per week. She reports that she does not use drugs.  Medications: Medications Prior to Admission  Medication Sig Dispense Refill   chlorthalidone (HYGROTON) 25 MG tablet Take by mouth.     cholecalciferol (VITAMIN D3) 25 MCG (1000 UNIT) tablet Take 1,000 Units by mouth daily.     Diclofenac Sodium CR 100 MG 24 hr tablet Take 1 tablet (100 mg total) by mouth daily. 10 tablet 0   Multiple Vitamins-Minerals (MULTIVITAMIN GUMMIES WOMENS PO) Take by mouth 2 (two) times daily.     sertraline (ZOLOFT) 50 MG tablet Take 50  mg by mouth daily.     valACYclovir (VALTREX) 1000 MG tablet Take 1,000 mg by mouth daily.     HYDROcodone-acetaminophen (NORCO/VICODIN) 5-325 MG tablet Take 1 tablet by mouth every 6 (six) hours as needed for severe pain. 5 tablet 0   phentermine (ADIPEX-P) 37.5 MG tablet Take by mouth.     traMADol (ULTRAM) 50 MG tablet Take 1 tablet (50 mg total) by mouth every 6 (six) hours as needed for severe pain. 7 tablet 0   UNABLE TO FIND Take 2 capsules by mouth daily. Med Name: Goli Gummies (blue gummies are stress reliever. Red gummies are for weight loss.)      Results for orders placed or performed during the hospital encounter of 10/02/23 (from the past 48 hour(s))  Basic metabolic panel per protocol     Status: Abnormal   Collection Time: 10/02/23  4:07 PM  Result Value Ref Range   Sodium 137 135 - 145 mmol/L   Potassium 3.3 (L) 3.5 - 5.1 mmol/L   Chloride 97 (L) 98 - 111 mmol/L   CO2 25 22 - 32 mmol/L   Glucose, Bld 85 70 - 99 mg/dL    Comment: Glucose reference range applies only to samples taken after  fasting for at least 8 hours.   BUN 18 6 - 20 mg/dL   Creatinine, Ser 4.69 (H) 0.44 - 1.00 mg/dL   Calcium 8.8 (L) 8.9 - 10.3 mg/dL   GFR, Estimated >62 >95 mL/min    Comment: (NOTE) Calculated using the CKD-EPI Creatinine Equation (2021)    Anion gap 15 5 - 15    Comment: Performed at [facility], [address], [place], [place] [address]  CBC     Status: Abnormal   Collection Time: 10/02/23  4:47 PM  Result Value Ref Range   WBC 6.0 4.0 - 10.5 K/uL   RBC 3.95 3.87 - 5.11 MIL/uL   Hemoglobin 11.3 (L) 12.0 - 15.0 g/dL   HCT 24.4 (L) 01.0 - 27.2 %   MCV 87.8 80.0 - 100.0 fL   MCH 28.6 26.0 - 34.0 pg   MCHC 32.6 30.0 - 36.0 g/dL   RDW 53.6 64.4 - 03.4 %   Platelets 306 150 - 400 K/uL   nRBC 0.0 0.0 - 0.2 %    Comment: Performed at [facility], [address] [place], [place] [address]    No results found.    Blood pressure 135/72, pulse 72, temperature  97.9 F (36.6 C), temperature source Oral, resp. rate 18, height 5\' 6"  (1.676 m), weight 95.7 kg, SpO2 100%.  General appearance: alert, cooperative, and appears stated age Head: Normocephalic, without obvious abnormality, atraumatic Neck: supple, symmetrical, trachea midline Extremities: Intact sensation and capillary refill all digits.  +epl/fpl/io.  Purulent drainage from around nail.  Tender at dip joint. Pulses: 2+ and symmetric Skin: Skin color, texture, turgor normal. No rashes or lesions Neurologic: Grossly normal Incision/Wound: as above  Assessment/Plan Left ring finger infection and distal phalanx fracture.  Plan incision and drainage in OR with removal of nail and possible drainage of dip joint.  Non operative and operative treatment options have been discussed with the patient and patient wishes to proceed with operative treatment. Risks, benefits, and alternatives of surgery have been discussed and the patient agrees with the plan of care.   Betha Loa 10/02/2023, 6:14 PM

## 2023-10-02 NOTE — Anesthesia Preprocedure Evaluation (Addendum)
Anesthesia Evaluation  Patient identified by MRN, date of birth, ID band Patient awake    Reviewed: Allergy & Precautions, NPO status , Patient's Chart, lab work & pertinent test results  History of Anesthesia Complications Negative for: history of anesthetic complications  Airway Mallampati: I  TM Distance: >3 FB Neck ROM: Full    Dental  (+) Dental Advisory Given, Teeth Intact   Pulmonary PE   Pulmonary exam normal        Cardiovascular hypertension, Pt. on medications Normal cardiovascular exam     Neuro/Psych  PSYCHIATRIC DISORDERS Anxiety     negative neurological ROS     GI/Hepatic Neg liver ROS,,, S/p gastric bypass    Endo/Other   K 3.3 Obesity   Renal/GU negative Renal ROS     Musculoskeletal negative musculoskeletal ROS (+)    Abdominal   Peds  Hematology  (+) Blood dyscrasia, anemia   Anesthesia Other Findings HSV  Reproductive/Obstetrics                             Anesthesia Physical Anesthesia Plan  ASA: 2  Anesthesia Plan: General   Post-op Pain Management: Minimal or no pain anticipated   Induction: Intravenous  PONV Risk Score and Plan: 3 and Treatment may vary due to age or medical condition, Ondansetron, Dexamethasone and Midazolam  Airway Management Planned: LMA  Additional Equipment: None  Intra-op Plan:   Post-operative Plan: Extubation in OR  Informed Consent: I have reviewed the patients History and Physical, chart, labs and discussed the procedure including the risks, benefits and alternatives for the proposed anesthesia with the patient or authorized representative who has indicated his/her understanding and acceptance.     Dental advisory given  Plan Discussed with: CRNA and Anesthesiologist  Anesthesia Plan Comments:        Anesthesia Quick Evaluation

## 2023-10-02 NOTE — Transfer of Care (Signed)
Immediate Anesthesia Transfer of Care Note  Patient: Jenna Davenport  Procedure(s) Performed: INCISION AND DRAINAGE LEFT RING FINGER (Left: Finger)  Patient Location: PACU  Anesthesia Type:General  Level of Consciousness: awake, alert , and patient cooperative  Airway & Oxygen Therapy: Patient Spontanous Breathing  Post-op Assessment: Report given to RN, Post -op Vital signs reviewed and stable, and Patient moving all extremities X 4  Post vital signs: Reviewed and stable  Last Vitals:  Vitals Value Taken Time  BP    Temp    Pulse 77 10/02/23 1919  Resp 16 10/02/23 1919  SpO2 96 % 10/02/23 1919  Vitals shown include unfiled device data.  Last Pain:  Vitals:   10/02/23 1557  TempSrc:   PainSc: 9          Complications: No notable events documented.

## 2023-10-03 ENCOUNTER — Other Ambulatory Visit: Payer: Self-pay

## 2023-10-03 NOTE — Anesthesia Postprocedure Evaluation (Signed)
Anesthesia Post Note  Patient: Jenna Davenport  Procedure(s) Performed: INCISION AND DRAINAGE LEFT RING FINGER (Left: Finger)     Patient location during evaluation: PACU Anesthesia Type: General Level of consciousness: awake Pain management: pain level controlled Vital Signs Assessment: post-procedure vital signs reviewed and stable Respiratory status: spontaneous breathing, nonlabored ventilation and respiratory function stable Cardiovascular status: blood pressure returned to baseline and stable Postop Assessment: no apparent nausea or vomiting Anesthetic complications: no   No notable events documented.  Last Vitals:  Vitals:   10/02/23 1930 10/02/23 1945  BP: 134/80 134/76  Pulse: 69 72  Resp: 16 16  Temp:    SpO2: 100% 97%    Last Pain:  Vitals:   10/02/23 1919  TempSrc:   PainSc: 0-No pain                 Alford Gamero P Nazaire Cordial

## 2023-10-04 ENCOUNTER — Encounter (HOSPITAL_COMMUNITY): Payer: Self-pay | Admitting: Orthopedic Surgery

## 2023-10-07 LAB — AEROBIC/ANAEROBIC CULTURE W GRAM STAIN (SURGICAL/DEEP WOUND)

## 2023-12-13 ENCOUNTER — Emergency Department (HOSPITAL_COMMUNITY): Payer: BC Managed Care – PPO

## 2023-12-13 ENCOUNTER — Encounter (HOSPITAL_COMMUNITY): Payer: Self-pay | Admitting: Emergency Medicine

## 2023-12-13 ENCOUNTER — Emergency Department (HOSPITAL_COMMUNITY)
Admission: EM | Admit: 2023-12-13 | Discharge: 2023-12-13 | Disposition: A | Discharge status: Home / Self Care | Payer: BC Managed Care – PPO | Attending: Emergency Medicine | Admitting: Emergency Medicine

## 2023-12-13 DIAGNOSIS — N644 Mastodynia: Secondary | ICD-10-CM | POA: Diagnosis not present

## 2023-12-13 DIAGNOSIS — R079 Chest pain, unspecified: Secondary | ICD-10-CM | POA: Diagnosis not present

## 2023-12-13 DIAGNOSIS — Y92410 Unspecified street and highway as the place of occurrence of the external cause: Secondary | ICD-10-CM | POA: Insufficient documentation

## 2023-12-13 DIAGNOSIS — S0990XA Unspecified injury of head, initial encounter: Secondary | ICD-10-CM | POA: Insufficient documentation

## 2023-12-13 MED ORDER — OXYCODONE HCL 5 MG PO TABS
5.0000 mg | ORAL_TABLET | Freq: Once | ORAL | Status: AC
  Administered 2023-12-13: 5 mg via ORAL
  Filled 2023-12-13: qty 1
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
## 2024-06-17 ENCOUNTER — Ambulatory Visit: Payer: BC Managed Care – PPO | Admitting: Hematology & Oncology

## 2024-06-17 ENCOUNTER — Other Ambulatory Visit: Payer: BC Managed Care – PPO

## 2024-06-21 ENCOUNTER — Other Ambulatory Visit: Payer: Self-pay

## 2024-06-21 DIAGNOSIS — I82432 Acute embolism and thrombosis of left popliteal vein: Secondary | ICD-10-CM

## 2024-06-22 ENCOUNTER — Inpatient Hospital Stay: Attending: Hematology & Oncology

## 2024-06-22 ENCOUNTER — Inpatient Hospital Stay (HOSPITAL_BASED_OUTPATIENT_CLINIC_OR_DEPARTMENT_OTHER): Admitting: Family

## 2024-06-22 DIAGNOSIS — Z86718 Personal history of other venous thrombosis and embolism: Secondary | ICD-10-CM | POA: Diagnosis present

## 2024-06-22 DIAGNOSIS — Z7982 Long term (current) use of aspirin: Secondary | ICD-10-CM | POA: Diagnosis not present

## 2024-06-22 DIAGNOSIS — I2699 Other pulmonary embolism without acute cor pulmonale: Secondary | ICD-10-CM

## 2024-06-22 DIAGNOSIS — Z7901 Long term (current) use of anticoagulants: Secondary | ICD-10-CM | POA: Insufficient documentation

## 2024-06-22 DIAGNOSIS — I82432 Acute embolism and thrombosis of left popliteal vein: Secondary | ICD-10-CM | POA: Diagnosis not present

## 2024-06-22 DIAGNOSIS — Z79899 Other long term (current) drug therapy: Secondary | ICD-10-CM | POA: Diagnosis not present

## 2024-06-22 DIAGNOSIS — Z86711 Personal history of pulmonary embolism: Secondary | ICD-10-CM | POA: Insufficient documentation

## 2024-06-22 LAB — CBC WITH DIFFERENTIAL (CANCER CENTER ONLY)
Abs Immature Granulocytes: 0 10*3/uL (ref 0.00–0.07)
Basophils Absolute: 0 10*3/uL (ref 0.0–0.1)
Basophils Relative: 0 %
Eosinophils Absolute: 0 10*3/uL (ref 0.0–0.5)
Eosinophils Relative: 1 %
HCT: 37.1 % (ref 36.0–46.0)
Hemoglobin: 12.1 g/dL (ref 12.0–15.0)
Immature Granulocytes: 0 %
Lymphocytes Relative: 38 %
Lymphs Abs: 2 10*3/uL (ref 0.7–4.0)
MCH: 28.8 pg (ref 26.0–34.0)
MCHC: 32.6 g/dL (ref 30.0–36.0)
MCV: 88.3 fL (ref 80.0–100.0)
Monocytes Absolute: 0.3 10*3/uL (ref 0.1–1.0)
Monocytes Relative: 6 %
Neutro Abs: 2.9 10*3/uL (ref 1.7–7.7)
Neutrophils Relative %: 55 %
Platelet Count: 306 10*3/uL (ref 150–400)
RBC: 4.2 MIL/uL (ref 3.87–5.11)
RDW: 14.4 % (ref 11.5–15.5)
WBC Count: 5.3 10*3/uL (ref 4.0–10.5)
nRBC: 0 % (ref 0.0–0.2)

## 2024-06-22 LAB — CMP (CANCER CENTER ONLY)
ALT: 12 U/L (ref 0–44)
AST: 21 U/L (ref 15–41)
Albumin: 4 g/dL (ref 3.5–5.0)
Alkaline Phosphatase: 53 U/L (ref 38–126)
Anion gap: 6 (ref 5–15)
BUN: 20 mg/dL (ref 6–20)
CO2: 31 mmol/L (ref 22–32)
Calcium: 9 mg/dL (ref 8.9–10.3)
Chloride: 101 mmol/L (ref 98–111)
Creatinine: 1.02 mg/dL — ABNORMAL HIGH (ref 0.44–1.00)
GFR, Estimated: >60 mL/min (ref >60)
Glucose, Bld: 84 mg/dL (ref 70–99)
Potassium: 3.2 mmol/L — ABNORMAL LOW (ref 3.5–5.1)
Sodium: 138 mmol/L (ref 135–145)
Total Bilirubin: 0.3 mg/dL (ref 0.0–1.2)
Total Protein: 6.9 g/dL (ref 6.5–8.1)

## 2024-06-22 NOTE — Progress Notes (Signed)
 Hematology and Oncology Follow Up Visit  Jenna Davenport [id number] May 04, 1971 53 y.o. 06/22/2024   Principle Diagnosis:  History of LLE DVT and LUL PTE, unprovoked -Previous patient of Dr. Donnajean -10/2016: small LUL pulmonary embolus; doppler showed acute DVT involving left popliteal vein; on Eliquis  since then -10/2018: hypercoag work-up negative for prothrombin gene mutation, Factor V Leiden and APLS   Past Treatment: 10/2016 - 01/2019: Eliquis  5mg  BID 01/2019 - present: Eliquis  2.5mg  BID for secondary ppx - completed October 2022   Current Therapy: EC Aspirin 81 mg daily   Interim History:  Jenna Davenport is here today for follow-up. She is doing well and has no complaints at this time.  She has been exercising and is losing weight with Zepbound. She is very excited about this.  Her appetite is good and she is staying well hydrated throughout the day.  Weight is 202 lbs (prev 211 lbs). She is scheduled for left ring finger proximal interphalangeal joint fusion. Surgery is having her stop the baby aspirin 7 days prior to surgery and then restart after.  No issues with fever, chills, n/v, cough, rash, dizziness, SOB, chest pain, palpitations, abdominal pain or changes in bowel or bladder habits.  No numbness or tingling in her extremities at this time.  No falls or syncope.  No bruising or petechiae.   ECOG Performance Status: 1 - Symptomatic but completely ambulatory  Medications:  Allergies as of 06/22/2024       Reactions   Bee Pollen Itching, Swelling    watery eyes    Pollen Extract Other (See Comments)   Itchy, watery eyes         Medication List        Accurate as of June 22, 2024  2:55 PM. If you have any questions, ask your nurse or doctor.          chlorthalidone 25 MG tablet Commonly known as: HYGROTON Take by mouth.   cholecalciferol 25 MCG (1000 UNIT) tablet Commonly known as: VITAMIN D3 Take 1,000 Units by mouth daily.   Diclofenac  Sodium CR  100 MG 24 hr tablet Take 1 tablet (100 mg total) by mouth daily.   MULTIVITAMIN GUMMIES WOMENS PO Take by mouth 2 (two) times daily.   oxyCODONE -acetaminophen  5-325 MG tablet Commonly known as: Percocet 1-2 tabs po q6 hours prn pain   sertraline 50 MG tablet Commonly known as: ZOLOFT Take 50 mg by mouth daily.   sulfamethoxazole -trimethoprim  800-160 MG tablet Commonly known as: Bactrim  DS Take 1 tablet by mouth 2 (two) times daily.   UNABLE TO FIND Take 2 capsules by mouth daily. Med Name: Goli Gummies (blue gummies are stress reliever. Red gummies are for weight loss.)   valACYclovir 1000 MG tablet Commonly known as: VALTREX Take 1,000 mg by mouth daily.        Allergies:  Allergies  Allergen Reactions   Bee Pollen Itching and Swelling     watery eyes    Pollen Extract Other (See Comments)    Itchy, watery eyes     Past Medical History, Surgical history, Social history, and Family History were reviewed and updated.  Review of Systems: All other 10 point review of systems is negative.   Physical Exam:  vitals were not taken for this visit.   Wt Readings from Last 3 Encounters:  10/02/23 211 lb (95.7 kg)  09/03/23 209 lb (94.8 kg)  06/19/23 220 lb 12.8 oz (100.2 kg)    Ocular: Sclerae unicteric, pupils equal, round  and reactive to light Ear-nose-throat: Oropharynx clear, dentition fair Lymphatic: No cervical or supraclavicular adenopathy Lungs no rales or rhonchi, good excursion bilaterally Heart regular rate and rhythm, no murmur appreciated Abd soft, nontender, positive bowel sounds MSK no focal spinal tenderness, no joint edema Neuro: non-focal, well-oriented, appropriate affect Breasts: Deferred   Lab Results  Component Value Date   WBC 5.3 06/22/2024   HGB 12.1 06/22/2024   HCT 37.1 06/22/2024   MCV 88.3 06/22/2024   PLT 306 06/22/2024   No results found for: FERRITIN, IRON, TIBC, UIBC, IRONPCTSAT Lab Results  Component Value Date    RBC 4.20 06/22/2024   No results found for: KPAFRELGTCHN, LAMBDASER, KAPLAMBRATIO No results found for: IGGSERUM, IGA, IGMSERUM No results found for: STEPHANY CARLOTA BENSON MARKEL EARLA JOANNIE DOC VICK, SPEI   Chemistry      Component Value Date/Time   NA 137 10/02/2023 1607   K 3.3 (L) 10/02/2023 1607   CL 97 (L) 10/02/2023 1607   CO2 25 10/02/2023 1607   BUN 18 10/02/2023 1607   CREATININE 1.03 (H) 10/02/2023 1607   CREATININE 1.20 (H) 06/19/2023 1505      Component Value Date/Time   CALCIUM 8.8 (L) 10/02/2023 1607   ALKPHOS 67 06/19/2023 1505   AST 21 06/19/2023 1505   ALT 9 06/19/2023 1505   BILITOT 0.3 06/19/2023 1505       Impression and Plan: Jenna Davenport is a very pleasant 53 yo female with history of LLE DVT and LUL PE diagnosed 10/2016. She has been on Eliquis  since that time. Hyper coag work up in 2019 was negative. She had been sedentary and then drove to [place] and back at time of diagnosis and feels they may have influenced her thrombotic event.  She completed 3 years of full dose Eliquis  as well as 2 years of maintenance in 2022.  She is currently on a baby aspirin a day and tolerating nicely.  No s/s of recurrence.  Follow-up in 1 year.   Lauraine Pepper, NP 6/25/20252:55 PM

## 2024-08-10 ENCOUNTER — Ambulatory Visit: Admitting: Podiatry

## 2024-08-10 DIAGNOSIS — Q666 Other congenital valgus deformities of feet: Secondary | ICD-10-CM

## 2024-08-10 NOTE — Progress Notes (Signed)
 Subjective:  Patient ID: Jenna Davenport, female    DOB: 1971-04-23,  MRN: [id number]  Chief Complaint  Patient presents with   Extensor tendinitis of foot    Foot follow up     53 y.o. female presents with the above complaint.  Patient presents with complaint bilateral flatfoot deformity.  She states that is causing her some discomfort she is not able to work or stand on her feet for long period of time she is not able to walk up the steps.  She does want to get it evaluated.  She would like to do another orthotics.  Denies any other acute issues.  Pain scale 7 out of 10 dull aching nature   Review of Systems: Negative except as noted in the HPI. Denies N/V/F/Ch.  Past Medical History:  Diagnosis Date   Anemia    Pulmonary embolism (HCC)     Current Outpatient Medications:    chlorthalidone (HYGROTON) 25 MG tablet, Take by mouth., Disp: , Rfl:    cholecalciferol (VITAMIN D3) 25 MCG (1000 UNIT) tablet, Take 1,000 Units by mouth daily. (Patient not taking: Reported on 06/22/2024), Disp: , Rfl:    Diclofenac  Sodium CR 100 MG 24 hr tablet, Take 1 tablet (100 mg total) by mouth daily. (Patient not taking: Reported on 06/22/2024), Disp: 10 tablet, Rfl: 0   Magnesium Citrate (MAGNESIUM GUMMIES PO), Take 1 tablet by mouth at bedtime., Disp: , Rfl:    Multiple Vitamins-Minerals (MULTIVITAMIN GUMMIES WOMENS PO), Take by mouth 2 (two) times daily. (Patient taking differently: Take by mouth daily at 6 (six) AM.), Disp: , Rfl:    oxyCODONE -acetaminophen  (PERCOCET) 5-325 MG tablet, 1-2 tabs po q6 hours prn pain (Patient not taking: Reported on 06/22/2024), Disp: 20 tablet, Rfl: 0   sertraline (ZOLOFT) 100 MG tablet, Take 100 mg by mouth daily., Disp: , Rfl:    sulfamethoxazole -trimethoprim  (BACTRIM  DS) 800-160 MG tablet, Take 1 tablet by mouth 2 (two) times daily. (Patient not taking: Reported on 06/22/2024), Disp: 14 tablet, Rfl: 0   UNABLE TO FIND, Take 2 capsules by mouth daily. Med Name: Goli Gummies  (blue gummies are stress reliever. Red gummies are for weight loss.) (Patient not taking: Reported on 06/22/2024), Disp: , Rfl:    valACYclovir (VALTREX) 1000 MG tablet, Take 1,000 mg by mouth daily., Disp: , Rfl:    ZEPBOUND 10 MG/0.5ML Pen, Inject 10 mg into the skin once a week., Disp: , Rfl:   Social History   Tobacco Use  Smoking Status Never  Smokeless Tobacco Never    Allergies  Allergen Reactions   Bee Pollen Itching and Swelling     watery eyes    Pollen Extract Other (See Comments)    Itchy, watery eyes    Sulfamethoxazole -Trimethoprim  Dermatitis    Other Reaction(s): Fever  itching  Other Reaction(s): Fever    itching   Objective:  There were no vitals filed for this visit. There is no height or weight on file to calculate BMI. Constitutional Well developed. Well nourished.  Vascular Dorsalis pedis pulses palpable bilaterally. Posterior tibial pulses palpable bilaterally. Capillary refill normal to all digits.  No cyanosis or clubbing noted. Pedal hair growth normal.  Neurologic Normal speech. Oriented to person, place, and time. Epicritic sensation to light touch grossly present bilaterally.  Dermatologic Nails well groomed and normal in appearance. No open wounds. No skin lesions.  Orthopedic: Bilateral pes planovalgus deformity noted with calcaneovalgus to many toe signs partially regarding the arch with dorsiflexion of the hallux  unable to perform single and double heel raise.   Radiographs: NOne Assessment:   1. Pes planovalgus    Plan:  Patient was evaluated and treated and all questions answered.  Pes planovalgus -Pes planovalgus -I explained to patient the etiology of pes planovalgus and relationship with Planter fasciitis and various treatment options were discussed.  Given patient foot structure in the setting of Planter fasciitis I believe patient will benefit from custom-made orthotics to help control the hindfoot motion support the arch of  the foot and take the stress away from plantar fascial.  Patient agrees with the plan like to proceed with orthotics -Patient was casted for orthotics   No follow-ups on file.

## 2024-08-12 ENCOUNTER — Telehealth: Payer: Self-pay | Admitting: Podiatry

## 2024-08-12 NOTE — Telephone Encounter (Addendum)
 Patient called in reference to medical accomodation form that was filled out in office 08/10/2024. Patient states that while it was filled out and faxed on that date her HR department never received them and she is requesting that they be refaxed to the number located at bottom of second page in bold. Refaxed today and attempted to call patient to confirm receipt- unable so left message to verify.

## 2024-09-19 ENCOUNTER — Other Ambulatory Visit

## 2024-10-11 ENCOUNTER — Other Ambulatory Visit

## 2025-04-17 ENCOUNTER — Inpatient Hospital Stay

## 2025-04-17 ENCOUNTER — Inpatient Hospital Stay: Admitting: Family

## 2025-06-23 ENCOUNTER — Other Ambulatory Visit

## 2025-06-23 ENCOUNTER — Ambulatory Visit: Admitting: Family
